# Patient Record
Sex: Male | Born: 1935 | Race: White | Hispanic: No | Marital: Married | Smoking: Current every day smoker
Health system: Southern US, Community
[De-identification: ages and names within clinical notes are randomized; demographics above are authoritative.]

## PROBLEM LIST (undated history)

## (undated) DIAGNOSIS — N289 Disorder of kidney and ureter, unspecified: Secondary | ICD-10-CM

## (undated) DIAGNOSIS — I714 Abdominal aortic aneurysm, without rupture, unspecified: Secondary | ICD-10-CM

## (undated) DIAGNOSIS — N183 Chronic kidney disease, stage 3 unspecified: Secondary | ICD-10-CM

## (undated) DIAGNOSIS — R55 Syncope and collapse: Secondary | ICD-10-CM

## (undated) DIAGNOSIS — I10 Essential (primary) hypertension: Secondary | ICD-10-CM

## (undated) DIAGNOSIS — I639 Cerebral infarction, unspecified: Secondary | ICD-10-CM

## (undated) HISTORY — PX: FRACTURE SURGERY: SHX138

## (undated) HISTORY — DX: Syncope and collapse: R55

## (undated) HISTORY — DX: Cerebral infarction, unspecified: I63.9

## (undated) HISTORY — DX: Disorder of kidney and ureter, unspecified: N28.9

## (undated) HISTORY — PX: APPENDECTOMY: SHX54

## (undated) HISTORY — DX: Abdominal aortic aneurysm, without rupture, unspecified: I71.40

## (undated) HISTORY — PX: EYE SURGERY: SHX253

## (undated) HISTORY — PX: HEMORRHOID SURGERY: SHX153

## (undated) HISTORY — DX: Abdominal aortic aneurysm, without rupture: I71.4

## (undated) HISTORY — DX: Essential (primary) hypertension: I10

## (undated) HISTORY — PX: SPINE SURGERY: SHX786

## (undated) HISTORY — PX: THYROID SURGERY: SHX805

---

## 1998-04-30 ENCOUNTER — Inpatient Hospital Stay (HOSPITAL_COMMUNITY): Admission: EM | Admit: 1998-04-30 | Discharge: 1998-05-05 | Payer: Self-pay | Admitting: Emergency Medicine

## 1998-05-22 ENCOUNTER — Ambulatory Visit (HOSPITAL_COMMUNITY): Admission: RE | Admit: 1998-05-22 | Discharge: 1998-05-22 | Payer: Self-pay | Admitting: Orthopedic Surgery

## 1998-05-26 ENCOUNTER — Encounter (HOSPITAL_COMMUNITY): Admission: RE | Admit: 1998-05-26 | Discharge: 1998-08-24 | Payer: Self-pay | Admitting: Orthopedic Surgery

## 1998-10-06 ENCOUNTER — Ambulatory Visit (HOSPITAL_COMMUNITY): Admission: RE | Admit: 1998-10-06 | Discharge: 1998-10-06 | Payer: Self-pay | Admitting: Gastroenterology

## 2001-04-24 ENCOUNTER — Emergency Department (HOSPITAL_COMMUNITY): Admission: EM | Admit: 2001-04-24 | Discharge: 2001-04-24 | Payer: Self-pay | Admitting: Emergency Medicine

## 2001-06-12 ENCOUNTER — Encounter: Admission: RE | Admit: 2001-06-12 | Discharge: 2001-06-12 | Payer: Self-pay

## 2003-10-09 ENCOUNTER — Encounter: Admission: RE | Admit: 2003-10-09 | Discharge: 2003-10-09 | Payer: Self-pay | Admitting: Family Medicine

## 2005-12-14 ENCOUNTER — Encounter: Payer: Self-pay | Admitting: Emergency Medicine

## 2005-12-14 ENCOUNTER — Encounter: Payer: Self-pay | Admitting: Cardiology

## 2005-12-14 ENCOUNTER — Encounter: Payer: Self-pay | Admitting: Orthopedic Surgery

## 2006-10-27 ENCOUNTER — Ambulatory Visit (HOSPITAL_COMMUNITY): Admission: RE | Admit: 2006-10-27 | Discharge: 2006-10-28 | Payer: Self-pay | Admitting: Ophthalmology

## 2007-04-07 ENCOUNTER — Ambulatory Visit: Payer: Self-pay | Admitting: Family Medicine

## 2007-04-07 ENCOUNTER — Observation Stay (HOSPITAL_COMMUNITY): Admission: EM | Admit: 2007-04-07 | Discharge: 2007-04-08 | Payer: Self-pay | Admitting: Emergency Medicine

## 2008-04-03 ENCOUNTER — Ambulatory Visit (HOSPITAL_COMMUNITY): Admission: RE | Admit: 2008-04-03 | Discharge: 2008-04-04 | Payer: Self-pay | Admitting: Ophthalmology

## 2008-04-03 ENCOUNTER — Encounter (INDEPENDENT_AMBULATORY_CARE_PROVIDER_SITE_OTHER): Payer: Self-pay | Admitting: Ophthalmology

## 2010-06-23 ENCOUNTER — Ambulatory Visit (HOSPITAL_COMMUNITY): Admission: RE | Admit: 2010-06-23 | Discharge: 2010-06-23 | Payer: Self-pay | Admitting: Physician Assistant

## 2010-12-28 NOTE — Op Note (Signed)
NAME:  Larry Aguilar, Larry Aguilar                ACCOUNT NO.:  [id number]   MEDICAL RECORD NO.:  [id number]          PATIENT TYPE:  OIB   LOCATION:  [id number]                         FACILITY:  [facility]   PHYSICIAN:  Chrystie Nose. Zigmund Daniel, M.D. DATE OF BIRTH:  Sep 21, 1933   DATE OF PROCEDURE:  DATE OF DISCHARGE:                               OPERATIVE REPORT   ADMISSION DIAGNOSIS:  Retained lens material, dislocated intraocular  lens into the vitreous, left eye.   PROCEDURES:  1. Pars plana vitrectomy with removal of intraocular lens from      vitreous.  2. Panretinal photocoagulation.  3. Placement of secondary intraocular lens with suture.  4. Gas injection, left eye.   SURGEON:  Chrystie Nose. Zigmund Daniel, MD   ASSISTANT:  Deatra Ina, MA   ANESTHESIA:  General.   DETAILS:  With usual prep and drape, peritomy from 8 o'clock around to 4  o'clock.  Two scleral flaps were raised at 3 o'clock and 9 o'clock in  anticipation of IOL suture.  Corneoscleral wound was created with a 3  layer technique from 11 o'clock to 2 o'clock.  The 25-gauge trocars were  placed at 10, 2, and 4 o'clock.  The pars plana vitrectomy was begun  just behind the pupillary axis.  Blood and vitreous was carefully  removed under low suction and rapid cutting.  The corneoscleral wound  was opened and the intraocular lens was freed from its attachments of  the vitreous.  The 25-gauge forceps were used to grasp the lens and sent  it into the anterior chamber.  The 20-gauge forceps were used to grasp  the lens in the anterior chamber and to dilate out of the eye.  Two 10-0  Prolene sutures were passed from 3 o'clock to 9 o'clock behind the iris  and anterior to the capsular remnants.  These sutures were externalized  through the corneal wound with a scleral wound.  A new intraocular lens  made by [employer] model CZ70BD, power 16.0D, length 12.5  mm, optic 7.0 mm, serial [id number], expiration date June 2012, was  brought onto  the field.  The lens was inspected and cleaned.  The  Prolene sutures were brought out through the scleral wound and attached  to the eyelids of the lens.  The lens was positioned in the posterior  chamber and dialed into place.  The corneal wound was closed with four  10-0 nylon sutures.  It was tested and found to be tight.  Additional  pars plana vitrectomy was performed behind the pseudophacos.  Some  additional pigment, granules, and vitreous was encountered and removed.  The indirect ophthalmoscope laser was moved into place, 636 burns were  placed around the periphery.  The power was 500 mW, 1000 microns each,  and 0.1 seconds each.  A gas injection was used to reinflate the globe.  The 25-gauge trocars were removed.  The wounds were tested and found to  be tight.  Conjunctiva was closed with 7-0 chromic suture.  Polymyxin  and gentamicin were irrigated into Tenon space.  Marcaine was injected  around  the globe for postop pain.  Decadron 10 mg was injected into the  lower subconjunctival space.  Polysporin ophthalmic ointment, a patch,  and shield were placed.  Closing pressure was 15 with a Barraquer  tonometer.   COMPLICATIONS:  None.   DURATION:  1 hour 30 minutes.   The patient was awakened and taken to recovery in satisfactory  condition.      Chrystie Nose. Zigmund Daniel, M.D.  Electronically Signed     JDM/MEDQ  D:  04/03/2008  T:  04/04/2008  Job:  [id number]

## 2010-12-28 NOTE — Discharge Summary (Signed)
NAME:  Larry Aguilar, Larry Aguilar NO.:  [id number]   MEDICAL RECORD NO.:  [id number]          PATIENT TYPE:  OBV   LOCATION:  [id number]                         FACILITY:  [facility]   PHYSICIAN:  Carin Hock, MD   DATE OF BIRTH:  [birth year]   DATE OF ADMISSION:  04/07/2007  DATE OF DISCHARGE:  04/08/2007                               DISCHARGE SUMMARY   PRIMARY CARE PHYSICIAN:  Nurse practitioner, Cyndi Bender at the office  of Dr. Lyndee Hensen in [place].   CONSULTANTS:  None.   PROCEDURES:  None.   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   DISCHARGE DIAGNOSIS:  Near-syncope.   SECONDARY DIAGNOSES:  1. Hypertension.  2. Hyperlipidemia.   LABORATORY DATA:  Includes electrolytes which were completely within  normal limits with a sodium of 139, potassium 3.6, chloride 109, bicarb  25, BUN 23, creatinine 1.03.  Glucose 106, total bilirubin 0.8, alk phos  63, AST 8, ALT 14, total protein 6.7, albumin 3.6, calcium 9.0.  PT/PTT  and INR were also within normal limits with a PTT of 28, PT of 13.5, INR  1.0.  CBC and differential all within normal limits.  White blood cell  count 7.5, hemoglobin 13.8, hematocrit 39.6 and platelets 162.  Cardiac  enzymes were cycled in the ER.  Point of care enzymes included a CK-MB  of 3.7, a troponin of less than 0.05 and a myoglobin of 134.  However,  subsequent cardiac enzymes showed an isolated increase in CK-MB  initially at 5.5 early in the morning of April 08, 2007, and than later  in the morning of April 08, 2007, 4.7 CK-MB.  CK and troponin were both  normal.  CK was 177 and then later 145, troponin was 0.01 and later  0.02.   DISCHARGE MEDICATIONS:  The patient was discharged on the same  medications he had been taking at home.   These include:  1. Fish oil.  2. Garlic pills.  3. Lisinopril.  4. Hydrochlorothiazide combo pill 20/25 mg 1 tab daily.   HOSPITAL COURSE:  1. Near-syncope:  There are multiple possible causes for this syncopal      episode including vasovagal orthostatic hypotension,      cardiovascular, neurologic, hypoglycemia.  The most likely cause in      this case seems to be hypoglycemia as the patient had a sudden      onset of diaphoresis, weakness and nausea.  He had not eaten for 6      hours prior to this episode.  He did not have any chest pain or      shortness of breath with this episode so this seems to be the most      likely cause.  However, other possible causes include a vasovagal      response, however, the patient does not report any straining before      or  during this episode.  Orthostatic hypertension is another      possible cause.  As the patient had not consumed any fluids while      he was working the morning of this event and he is on a diuretic.      Cardiac causes may also be contributing, however, his EKG only      showed first degree AV block and sinus bradycardia and possibly      some borderline LVH.  Point of care cardiac enzymes were negative      and there were no reports of arrhythmia while the patient was      monitored on telemetry.  Also the patient did not report any      shortness of breath or chest pain or palpitations with this      episode.  However, as mentioned in the lab section there were 2      isolated measurements of elevated CK-MB at 5.5 and at 4.7.  Also      the relative index of CK-MB to CK was elevated first at 3.1 and      later at 3.2.  This probably was not an MI given there were no      concerning findings on EKG for an MI or ischemia but the patient      does have multiple risk factors such as smoking, hyperlipidemia and      hypertension which put him at risk for cardiac disease.  We      recommend that the patient get a cardiac stress  test.  Other      possibilities we considered were neurological causes such as a      stroke or TIA but the patient had a normal head CT showing only      chronic ischemic changes and his neuro exam was grossly normal.      Over night we gently hydrated the patient with normal saline at 50      mL an hour.  We cycled cardiac enzymes result of which are      mentioned previously and monitored him overnight on telemetry.      There were no events and no arrhythmias while he was being      monitored on telemetry.  2. Hypertension:  We continued the patient's lisinopril,      hydrochlorothiazide.  He was discharged on these medicines.  3. Hyperlipidemia.  We continued fish oil while in the hospital and      discharged the patient instructing him to take his garlic pills and      his fish oil as previously prescribed.   The patient's condition at the time of discharge stable.  Pending test  results at the time of discharge.  We checked a hemoglobin A1c which is  not back at the time of this dictation.  Will put that result in a  subsequent discharge summary addendum.   DISCHARGE FOLLOWUP:  The patient is instructed to follow up with Cyndi Bender at [facility] office.  He has an appointment already scheduled for  this week.   FOLLOW UP ISSUES:  1. Cardiac stress test is probably warranted in this patient given his      risk factors and the above mentioned test results.  2. Possible follow up for hypoglycemia.  Wife reports that the patient      has increasingly had polydipsia and may have some issue with blood      sugar regulation  especially if this was, in deed, a hypoglycemia      event.  Will follow up on hemoglobin A1c result and will put that      result in a discharge addendum.      Carin Hock, MD  Electronically Signed     SO/MEDQ  D:  04/08/2007  T:  04/08/2007  Job:  [id number]   cc:   Cyndi Bender at Dr. Onnie Boer office

## 2010-12-28 NOTE — H&P (Signed)
NAME:  Larry Aguilar, Larry Aguilar NO.:  [id number]   MEDICAL RECORD NO.:  [id number]          PATIENT TYPE:  OBV   LOCATION:  6711                         FACILITY:  [facility]   PHYSICIAN:  Talbert Cage, M.D.DATE OF BIRTH:  [birth year]   DATE OF ADMISSION:  04/07/2007  DATE OF DISCHARGE:  04/08/2007                              HISTORY & PHYSICAL   PRIMARY CARE PHYSICIAN:  Dr. Lyndee Hensen in [place]; patient actually  sees her PA, Sharol Roussel   CHIEF COMPLAINT:  Near-syncope.   HISTORY OF PRESENTING ILLNESS:  Patient is a 77 year old male with a  history of hypertension, hyperlipidemia and questionable past CVA, who  presents with a brief episode of lightheadedness, nausea, diaphoresis  and weakness.  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  He was in an air-conditioned tractor; he then got  out of tractor and got into his truck which is also air conditioned when  he suddenly became lightheaded, diaphoretic, nauseous and weak.  He was  able to exit the truck and sit down.  He never loss consciousness.  He  also denies chest pain, one-sided weakness, shortness of breath and  vomiting.  After about one hour, he felt considerably better; he was no  longer dizzy, nauseous or diaphoretic, but he did continue to be weak.  His wife insisted on calling the ambulance and he was brought via  ambulance to [facility] Emergency [facility].  Of note, the patient had  gone about six hours without eating or drinking anything prior to this  episode.   REVIEW OF SYSTEMS:  Positive for recent polydipsia, coughing and nausea.  Negative for fever, chills, vomiting, chest pain, shortness of breath,  dysuria, changes in bowel movements, focal weakness.   In the ER, the patient got a bolus of normal saline one liter, an EKG,  CT head and a chest x-ray.   PAST MEDICAL HISTORY:  Patient has:  1. Hypertension.  2. Hyperlipidemia.  3. Reports that he  had a mini-stroke in the past.   MEDICATION ALLERGIES:  INCLUDE STATINS WHICH CAUSE MYALGIAS AND  PENICILLIN WHICH MAY CAUSE ANAPHYLAXIS, NOT SURE BASED ON THE PATIENT'S  DESCRIPTION.   MEDICATIONS:  Include:  1. Lisinopril/hydrochlorothiazide 20/25 one tablet daily.  2. Fish oil.  3. Garlic pills.   PAST SURGERIES:  Include:  1. Cataract surgery.  2. Repair of a broken hip.  3. Removal of a benign growth from the patient's throat.  4. Hernia repair.   SOCIAL HISTORY:  Patient lives with his wife, has about 110-pack-year  history, smoked 55 years about two packs a day, no alcohol, no illicit  drug.  Patient works on his farm, does quite a bit of physical labor.   PHYSICAL EXAMINATION:  VITAL SIGNS:  Temperature 97, pulse 62,  respirations 18, blood pressure 115/69, oxygen saturation 97% on room  air.  GENERAL:  Patient resting comfortably in no acute distress.  HEENT:  Patient's head was normocephalic, atraumatic.  Pupils were  equally round and reactive to light.  Extraocular movements intact.  Oropharynx was clear.  Mucous membranes were moist.  CARDIOVASCULAR EXAM:  Distant heart sounds, but regular rate and rhythm.  No murmurs, rubs or gallops.  PULMONARY EXAM:  Patient was clear to auscultation bilaterally.  No  wheezes, rhonchi or rales.  ABDOMEN:  Soft, nontender, nondistended.  Positive bowel sounds.  EXTREMITIES:  No clubbing, cyanosis or edema.  2+ dorsalis pedis pulses  bilaterally.  NEURO EXAM:  Cranial nerves II-XII were grossly intact.  Patient was  appropriate, alert and oriented, answered questions, seemed to be a good  historian.  Strength was 5/5 in upper and lower extremities, equal on  both sides.   LABS:  UA was within normal limits.  Electrolytes were within normal  limits with a sodium of 139, potassium 3.6, chloride 109, bicarb 25, BUN  23, creatinine 1.03, glucose 106.  Total bilirubin 0.8, alk phos 63, AST  8, ALT 14, total protein 6.7, albumin 3.6  and calcium 9.0.  CBC was also  within normal limits with a white blood cell count of 7.5, hemoglobin  13.8, hematocrit 39.6, platelets of 162; differential was within normal  limits.  PTT was 28, PT was 13.5, INR was 1.0.  Cardiac enzymes were  within normal limits with a CK-MB of 3.7, a troponin of less than 0.05  and a myoglobin of 134.   Chest x-ray showed chronic COPD, but no acute chest process.   CT head showed chronic ischemic changes, most notably in the cerebellum  right more so than left, but no acute abnormality.   ASSESSMENT/PLAN:  Patient is a 77 year old male with a past medical  history of hyperlipidemia, hypertension and questionable cerebrovascular  accident, who is admitted for observation after a near-syncope episode.   1. Near-syncope.  Possible causes include vasovagal response,      orthostatic hypotension, cardiovascular causes, neurologic causes      and hypoglycemia.  Most likely cause in this case seems to be      hypoglycemia given the sudden onset and description of the symptoms      with sudden diaphoresis, nausea, lightheadedness.  Also, the      patient had not eaten in about six hours.  Another possible cause      is orthostatic hypotension, as the patient reports he did not drink      fluids while he was working; he is also on a diuretic, however this      would not explain his diaphoresis and his nausea.  Additionally,      patient is a high risk for cardiac causes with a history of      smoking, hypertension and hyperlipidemia.  His EKG showed only      first-degree AV block and sinus bradycardia and some borderline      left ventricular hypertrophy.  Point-of-care cardiac enzymes were      negative and there were no reports of arrhythmias while on the tele      monitor in the emergency room.  Additionally, patient is without      complaints of chest pain or shortness of breath or palpitations      during or after this episode so this seem to be a  less likely      cause.  Vasovagal syncope is another possibility, but the patient      does not report any straining.  Additionally, that would not      explain his diaphoresis or his nausea.  Neurological causes such as  a stroke or transient ischemic attack are possibilities, but there      are no reports of focal weakness, nor none seen on exam.  Head CT      showed no acute processes and his neuro exam was grossly normal.      Plan:  We will cycle cardiac enzymes, place the patient on tele,      check a B-Met in the morning, check a hemoglobin A1c, check      orthostatic vitals and will gently hydrate overnight.  2. Hypertension.  Will continue lisinopril/hydrochlorothiazide.  3. Hyperlipidemia.  Will continue fish oil.  4. Disposition:  Patient can probably discharge home in the morning      with followup with his primary care Kylin Dubs provided his cardiac      enzymes are normal and there are no other gross abnormalities on      his studies.      Carin Hock, MD  Electronically Signed      Talbert Cage, M.D.  Electronically Signed    SO/MEDQ  D:  04/07/2007  T:  04/08/2007  Job:  [id number]

## 2010-12-29 ENCOUNTER — Encounter (HOSPITAL_COMMUNITY)
Admission: RE | Admit: 2010-12-29 | Discharge: 2010-12-29 | Disposition: A | Discharge status: Home / Self Care | Payer: Medicare Other | Source: Ambulatory Visit | Attending: Ophthalmology | Admitting: Ophthalmology

## 2010-12-29 ENCOUNTER — Ambulatory Visit (HOSPITAL_COMMUNITY)
Admission: RE | Admit: 2010-12-29 | Discharge: 2010-12-29 | Disposition: A | Discharge status: Home / Self Care | Payer: Medicare Other | Source: Ambulatory Visit | Attending: Ophthalmology | Admitting: Ophthalmology

## 2010-12-29 ENCOUNTER — Other Ambulatory Visit: Payer: Self-pay | Admitting: Ophthalmology

## 2010-12-29 ENCOUNTER — Other Ambulatory Visit (HOSPITAL_COMMUNITY): Payer: Self-pay | Admitting: Ophthalmology

## 2010-12-29 DIAGNOSIS — R059 Cough, unspecified: Secondary | ICD-10-CM | POA: Insufficient documentation

## 2010-12-29 DIAGNOSIS — Z01818 Encounter for other preprocedural examination: Secondary | ICD-10-CM | POA: Insufficient documentation

## 2010-12-29 DIAGNOSIS — R05 Cough: Secondary | ICD-10-CM | POA: Insufficient documentation

## 2010-12-29 DIAGNOSIS — H409 Unspecified glaucoma: Secondary | ICD-10-CM | POA: Insufficient documentation

## 2010-12-29 DIAGNOSIS — Z01812 Encounter for preprocedural laboratory examination: Secondary | ICD-10-CM | POA: Insufficient documentation

## 2010-12-29 DIAGNOSIS — J4489 Other specified chronic obstructive pulmonary disease: Secondary | ICD-10-CM | POA: Insufficient documentation

## 2010-12-29 DIAGNOSIS — J449 Chronic obstructive pulmonary disease, unspecified: Secondary | ICD-10-CM | POA: Insufficient documentation

## 2010-12-29 LAB — CBC
HCT: 38.5 % — ABNORMAL LOW (ref 39.0–52.0)
MCH: 32.2 pg (ref 26.0–34.0)
MCHC: 34.8 g/dL (ref 30.0–36.0)
MCV: 92.5 fL (ref 78.0–100.0)
Platelets: 176 10*3/uL (ref 150–400)
RDW: 13.1 % (ref 11.5–15.5)
WBC: 7.1 10*3/uL (ref 4.0–10.5)

## 2010-12-29 LAB — BASIC METABOLIC PANEL
BUN: 21 mg/dL (ref 6–23)
Calcium: 9.2 mg/dL (ref 8.4–10.5)
Creatinine, Ser: 1.43 mg/dL (ref 0.4–1.5)
GFR calc non Af Amer: 48 mL/min — ABNORMAL LOW (ref >60)
Glucose, Bld: 98 mg/dL (ref 70–99)
Potassium: 3.8 mEq/L (ref 3.5–5.1)

## 2010-12-31 NOTE — Op Note (Signed)
NAME:  THAO, DAFFERN                ACCOUNT NO.:  [id number]   MEDICAL RECORD NO.:  [id number]          PATIENT TYPE:  AMB   LOCATION:  SDS                          FACILITY:  [facility]   PHYSICIAN:  John D. Zigmund Daniel, M.D. DATE OF BIRTH:  1934-03-15   DATE OF PROCEDURE:  10/27/2006  DATE OF DISCHARGE:                               OPERATIVE REPORT   ADMISSION DIAGNOSIS:  Retained lens material, capsular fibrosis, left  eye.   PROCEDURE:  Removal of retained lens material, pars plana vitrectomy,  removal of vitreous membranes, and posterior capsulectomy, all in the  left eye.   SURGEON:  Chrystie Nose. Zigmund Daniel, M.D.   ASSISTANT:  Thurman Coyer. Lundquist, P.A.-C.   ANESTHESIA:  General.   DETAILS:  Usual prep and drape, 25 gauge trocars placed at 10, 2 and 4  o'clock.  Infusion at 4 o'clock.  The pars plana vitrectomy was begun  just behind the crystalline lens where retained white lens material was  encountered.  This was carefully removed from around the intraocular  lens.  The lens was stable and did not move. Provisc was placed on the  corneal surface. The vitrectomy was carried posteriorly where membranes  were apparent on the retinal surface.  These were peeled with a lighted  pick and removed with the vitreous cutter.  Large chunks of white lens  material were removed and yellow nuclear material was removed with the  chop technique using the cutter and the lighted pick.  Scleral  depression was used to gain access to the vitreous base and additional  lens material was seen here and removed. The endolaser was positioned in  the eye.  537 burns were placed around the retinal periphery with a  power 1000 milliwatts, 1000 microns each, and 0.1 seconds each.  Additional lens fragments were encountered and carefully removed under  low suction and rapid cutting. The instruments were removed from the  eye.  The trocars were removed.  The wounds were held tightly until they  were watertight.   The indirect ophthalmoscope laser was moved into  place. 1099 burns were placed around the retinal periphery with a power  660 milliwatts, 1000 microns each, and 0.1 seconds each.  Polymyxin and  gentamicin were irrigated into tenon's space.  Marcaine was injected  around the globe for postop pain. Decadron 10 mg was injected into the  lower subconjunctival space.  TobraDex ophthalmic ointment, a patch and  shield were placed.  The closing pressure was 10 with the [employer].  Complications none.  Duration 1 hour.  The patient was  awakened and taken to recovery in satisfactory condition.      Chrystie Nose. Zigmund Daniel, M.D.  Electronically Signed     JDM/MEDQ  D:  10/27/2006  T:  10/29/2006  Job:  [id number]

## 2011-01-05 ENCOUNTER — Ambulatory Visit (HOSPITAL_COMMUNITY)
Admission: RE | Admit: 2011-01-05 | Discharge: 2011-01-05 | Disposition: A | Discharge status: Home / Self Care | Payer: Medicare Other | Source: Ambulatory Visit | Attending: Ophthalmology | Admitting: Ophthalmology

## 2011-01-05 DIAGNOSIS — J4489 Other specified chronic obstructive pulmonary disease: Secondary | ICD-10-CM | POA: Insufficient documentation

## 2011-01-05 DIAGNOSIS — Z8673 Personal history of transient ischemic attack (TIA), and cerebral infarction without residual deficits: Secondary | ICD-10-CM | POA: Insufficient documentation

## 2011-01-05 DIAGNOSIS — J449 Chronic obstructive pulmonary disease, unspecified: Secondary | ICD-10-CM | POA: Insufficient documentation

## 2011-01-05 DIAGNOSIS — I10 Essential (primary) hypertension: Secondary | ICD-10-CM | POA: Insufficient documentation

## 2011-01-05 DIAGNOSIS — H409 Unspecified glaucoma: Secondary | ICD-10-CM | POA: Insufficient documentation

## 2011-01-05 DIAGNOSIS — F17200 Nicotine dependence, unspecified, uncomplicated: Secondary | ICD-10-CM | POA: Insufficient documentation

## 2011-01-05 DIAGNOSIS — H4011X0 Primary open-angle glaucoma, stage unspecified: Secondary | ICD-10-CM | POA: Insufficient documentation

## 2011-01-05 LAB — SURGICAL PCR SCREEN: Staphylococcus aureus: NEGATIVE

## 2011-02-03 NOTE — Op Note (Signed)
NAME:  Larry Aguilar, Larry Aguilar NO.:  [id number]  MEDICAL RECORD NO.:  [id number]           PATIENT TYPE:  O  LOCATION:  SDSC                         FACILITY:  [facility]  PHYSICIAN:  Marylynn Pearson, M.D.     DATE OF BIRTH:  [birth year]  DATE OF PROCEDURE:  01/05/2011 DATE OF DISCHARGE:  01/05/2011                              OPERATIVE REPORT   PREOPERATIVE DIAGNOSIS:  Uncontrolled primary open-angle glaucoma with preexisting scarring from previous retinal and cataract surgery, left eye.  POSTOPERATIVE DIAGNOSIS:  Uncontrolled primary open-angle glaucoma with preexisting scarring from previous retinal and cataract surgery, left eye.  PROCEDURE:  An Ahmed valve with mitomycin C.  COMPLICATIONS:  None.  ANESTHESIA:  Consisted of 2% Xylocaine and a 50/50 mixture of 0.75% Marcaine with ampule of Wydase.  PROCEDURE IN DETAILS:  The patient was transported to the operating room where under monitored anesthesia, he was given a peribulbar block with the aforementioned local anesthetic agent.  Following this, the patient's face was prepped and draped in usual sterile fashion after pressure had been applied to the eye.  With the surgeon sitting superiorly, a 6-0 nylon suture was passed through clear cornea to infraduct the eye and additional 6-0 suture was passed superior nasal. With the eye in the intraductal position, it was noted there was extensive conjunctival scarring.  A Hoskin forceps was used to grasp the conjunctivae at the 11:30 position and a Westcott scissor was used to make an incision at the limbus extending temporally.  Using a blunt Westcott scissor, the incision was extended to form a fornix-based conjunctival flap.  Bleeding ensued and a tip blade was used to recess Tenon fibers.  Bleeding was controlled with cautery.  After the episcleral scarring had been cleared and bleeding had been controlled, mitomycin C 0.4 mg/mL was placed on a Gelfoam sponge and  applied to the superior temporal conjunctivae, allowed to stay on the eye for 4 minutes.  Sponge was removed and it was irrigated with 60 mL of balanced salt solution.  Following this, the Ahmed valve was examined.  It was primed.  It was noted to have no defects.  It was irrigated.  The Ahmed was a [id number], model FP7 flexible plate, lot number [id number].  Using a Bishop-Harmon forceps for the plate and a Hoskins forceps to elevate the conjunctiva, the plate was gently placed and passed on the sclera and into the superior temporal quadrant until the eyelets were as measured 8 mm from the corneoscleral limbus.  Following this, a 4-0 Mersilene suture on a spatula needle was then used to suture the eyelets approximately 7.5 mm from the corneoscleral limbus.  After this was in place, the tube attached to the Ahmed, was cut, bevel up with a Emergency planning/management officer.  Then using a Wheeler blade at the 4 o'clock position, the clear cornea was entered and Provisc was injected in the eye.  Following this using a 22-gauge needle at the 12 o'clock position, the anterior chamber was entered without touching cornea or iris.  It was then removed.  Provisc was injected in the eye.  Using a  tube inserter and a tying forceps, the tube was placed.  It was noted to extend past the pupil into the visual axis.  Therefore the tube was withdrawn and it was shortened.  It was then placed again and noted to not touch the iris or the lens and it was in perfect position.  Therefore a 9-0 nylon suture was used to suture the tube to the sclera.  Following this the Tutoplast was then taken from the package and fashioned.  The Tutoplast graft was reference number [id number].  It was noted to have no defects.  It was irrigated.  It was fashioned.  It was placed over the tube and sutured with 4 interrupted 9-0 nylon sutures.  Following this, the conjunctiva was then sutured with a 9-0 Vicryl on a BV 100 needle.  It was sutured at  the limbus.  It was difficult for closure because of the extensive scarring and some bleeding.  It was sutured until all areas exposed, areas of Tutoplast and the tube were completely covered.  BSS was injected in the anterior chamber.  The chamber deepened.  Viscoelastic was expressed from the eye.  The incision was Seidel negative.  A subconjunctival injection of Kenalog 4 mg was placed in the superior and inferior quadrant subconjunctivally.  There being no leakage, all instrumentation removed from the eye.  The sutures were removed from the eye.  The lid speculum was removed.  The anterior chamber was deep. Tube was in a good position.  Topical TobraDex ointment was applied to the eye.  A patch and Fox shield were placed and the patient returned to recovery area in a stable condition.  Complications were none.     Marylynn Pearson, M.D.     RW/MEDQ  D:  01/05/2011  T:  01/06/2011  Job:  [id number]  cc:   [phone]  Electronically Signed by Marylynn Pearson M.D. on 02/03/2011 08:05:53 PM

## 2011-05-27 LAB — URINALYSIS, ROUTINE W REFLEX MICROSCOPIC
Nitrite: NEGATIVE
Protein, ur: NEGATIVE
Specific Gravity, Urine: 1.017
Urobilinogen, UA: 0.2

## 2011-05-27 LAB — CARDIAC PANEL(CRET KIN+CKTOT+MB+TROPI)
CK, MB: 5.5 — ABNORMAL HIGH
Relative Index: 3.1 — ABNORMAL HIGH
Total CK: 177
Troponin I: 0.01
Troponin I: 0.02

## 2011-05-27 LAB — PROTIME-INR
INR: 1
Prothrombin Time: 13.5

## 2011-05-27 LAB — BASIC METABOLIC PANEL
BUN: 20
Calcium: 8.7
Chloride: 103
Creatinine, Ser: 1.08
GFR calc non Af Amer: >60

## 2011-05-27 LAB — DIFFERENTIAL
Eosinophils Relative: 2
Lymphocytes Relative: 19
Lymphs Abs: 1.4
Monocytes Relative: 6
Neutrophils Relative %: 72

## 2011-05-27 LAB — COMPREHENSIVE METABOLIC PANEL
AST: 8
CO2: 25
Calcium: 9
Creatinine, Ser: 1.03
GFR calc Af Amer: >60
GFR calc non Af Amer: >60
Sodium: 139
Total Protein: 6.7

## 2011-05-27 LAB — CBC
MCHC: 34.9
MCV: 95.5
RBC: 4.15 — ABNORMAL LOW
RDW: 13.3

## 2011-05-27 LAB — POCT CARDIAC MARKERS: Troponin i, poc: <0.05

## 2011-05-27 LAB — HEPATITIS PANEL, ACUTE
Hep B C IgM: NEGATIVE
Hepatitis B Surface Ag: NEGATIVE

## 2011-05-27 LAB — HEMOGLOBIN A1C: Hgb A1c MFr Bld: 5.8

## 2011-11-29 ENCOUNTER — Encounter: Payer: Self-pay | Admitting: *Deleted

## 2012-05-21 ENCOUNTER — Other Ambulatory Visit: Payer: Self-pay | Admitting: Cardiovascular Disease

## 2012-05-21 DIAGNOSIS — I714 Abdominal aortic aneurysm, without rupture: Secondary | ICD-10-CM

## 2012-05-22 LAB — CREATININE, SERUM: Creat: 1.63 mg/dL — ABNORMAL HIGH (ref 0.50–1.35)

## 2012-05-24 ENCOUNTER — Ambulatory Visit
Admission: RE | Admit: 2012-05-24 | Discharge: 2012-05-24 | Disposition: A | Discharge status: Home / Self Care | Payer: Medicare Other | Source: Ambulatory Visit | Attending: Cardiovascular Disease | Admitting: Cardiovascular Disease

## 2012-05-24 DIAGNOSIS — I714 Abdominal aortic aneurysm, without rupture: Secondary | ICD-10-CM

## 2012-05-24 MED ORDER — IOHEXOL 300 MG/ML  SOLN: 75.0000 mL | Freq: Once | INTRAMUSCULAR | Status: AC | PRN

## 2012-06-12 HISTORY — PX: CARDIOVASCULAR STRESS TEST: SHX262

## 2012-08-15 HISTORY — PX: INGUINAL HERNIA REPAIR: SUR1180

## 2013-07-01 ENCOUNTER — Ambulatory Visit: Payer: Medicare Other | Admitting: Cardiovascular Disease

## 2013-07-02 ENCOUNTER — Encounter: Payer: Self-pay | Admitting: Cardiovascular Disease

## 2014-10-03 DIAGNOSIS — I714 Abdominal aortic aneurysm, without rupture: Secondary | ICD-10-CM | POA: Diagnosis not present

## 2014-10-03 DIAGNOSIS — M542 Cervicalgia: Secondary | ICD-10-CM | POA: Diagnosis not present

## 2014-10-03 DIAGNOSIS — E780 Pure hypercholesterolemia: Secondary | ICD-10-CM | POA: Diagnosis not present

## 2014-10-03 DIAGNOSIS — I10 Essential (primary) hypertension: Secondary | ICD-10-CM | POA: Diagnosis not present

## 2014-10-03 DIAGNOSIS — N183 Chronic kidney disease, stage 3 (moderate): Secondary | ICD-10-CM | POA: Diagnosis not present

## 2014-11-10 DIAGNOSIS — Q613 Polycystic kidney, unspecified: Secondary | ICD-10-CM | POA: Diagnosis not present

## 2014-11-10 DIAGNOSIS — I714 Abdominal aortic aneurysm, without rupture: Secondary | ICD-10-CM | POA: Diagnosis not present

## 2014-12-01 ENCOUNTER — Encounter: Payer: Self-pay | Admitting: Vascular Surgery

## 2014-12-02 ENCOUNTER — Encounter: Payer: Self-pay | Admitting: Vascular Surgery

## 2014-12-02 ENCOUNTER — Ambulatory Visit (INDEPENDENT_AMBULATORY_CARE_PROVIDER_SITE_OTHER): Payer: Medicare Other | Admitting: Vascular Surgery

## 2014-12-02 VITALS — BP 128/85 | HR 72 | Temp 97.6°F | Resp 16 | Ht 72.0 in | Wt 158.0 lb

## 2014-12-02 DIAGNOSIS — I714 Abdominal aortic aneurysm, without rupture, unspecified: Secondary | ICD-10-CM

## 2014-12-02 NOTE — Progress Notes (Signed)
Subjective:     Patient ID: Larry Aguilar, male   DOB: 07-13-1934, 81 y.o.   MRN: [id number]  HPI this 81 year old male was referred by Dr. Cyndi Bender for evaluation of abdominal aortic aneurysm. This is been followed for the past few years and is currently 4.4 x 4.0 cm maximum diameter by ultrasound. Patient has had no abdominal or back symptoms.  Past Medical History  Diagnosis Date  . Syncope   . AAA (abdominal aortic aneurysm)   . Hypertension   . Kidney disease     Left kidney mass,  polycystic kidney  . Stroke     History  Substance Use Topics  . Smoking status: Current Every Day Smoker -- 2.00 packs/day  . Smokeless tobacco: Not on file  . Alcohol Use: No    Family History  Problem Relation Age of Onset  . Heart attack Father   . Heart disease Father   . Hyperlipidemia Father   . Hypertension Father   . Cancer Mother     lymphoma   . Heart disease Mother   . Hyperlipidemia Mother   . Hypertension Mother   . Cancer Daughter     Thyroid cancer   . Hypertension Son   . Diabetes Son   . Hypertension Daughter     Allergies  Allergen Reactions  . Penicillins      Current outpatient prescriptions:  .  amLODipine (NORVASC) 5 MG tablet, Take 5 mg by mouth daily., Disp: , Rfl:  .  HYDROcodone-acetaminophen (NORCO/VICODIN) 5-325 MG per tablet, Take 1 tablet by mouth 3 (three) times daily as needed for moderate pain. , Disp: , Rfl:  .  lisinopril (PRINIVIL,ZESTRIL) 20 MG tablet, Take 20 mg by mouth daily., Disp: , Rfl:  .  meclizine (ANTIVERT) 25 MG tablet, Take 25 mg by mouth 3 (three) times daily as needed for dizziness., Disp: , Rfl:  .  omeprazole (PRILOSEC) 20 MG capsule, Take 20 mg by mouth daily., Disp: , Rfl:   Filed Vitals:   12/02/14 1112  BP: 128/85  Pulse: 72  Temp: 97.6 F (36.4 C)  TempSrc: Oral  Resp: 16  Height: 6' (1.829 m)  Weight: 158 lb (71.668 kg)  SpO2: 97%    Body mass index is 21.42 kg/(m^2).         Review of Systems  denies chest pain, dyspnea on exertion, PND, orthopnea. Patient has long history of tobacco abuse 2 packs a day for 65 years. Has known polycystic kidney disease on the left. Mild chronic renal insufficiency with creatinine 1.5.     Objective:   Physical Exam BP 128/85 mmHg  Pulse 72  Temp(Src) 97.6 F (36.4 C) (Oral)  Resp 16  Ht 6' (1.829 m)  Wt 158 lb (71.668 kg)  BMI 21.42 kg/m2  SpO2 97%  Gen.-alert and oriented x3 in no apparent distress HEENT normal for age Lungs no rhonchi or wheezing Cardiovascular regular rhythm no murmurs carotid pulses 3+ palpable no bruits audible Abdomen soft nontender no palpable masses Musculoskeletal free of  major deformities Skin clear -no rashes Neurologic normal Lower extremities 3+ femoral and posterior tibial pulses palpable bilaterally with no edema  Today I reviewed the report of the ultrasound performed in [place] in March 2016 which reveals it aneurysm to be 4.0 x 4.4 cm in maximum diameter       Assessment:     Abdominal aortic aneurysm-4.0 x 4.4 cm by ultrasound in [place] March 2016 Long history of tobacco abuse-ongoing  History polycystic kidney disease on the left with mild renal insufficiency creatinine 1.5    Plan:     Patient return in one year with CT angiogram of abdomen and pelvis to assess size and to determine if this would be a candidate for stent grafting if necessary Discussed symptoms of leakage or rupture with patient and his daughter

## 2014-12-04 NOTE — Addendum Note (Signed)
Addended by: Mena Goes on: 12/04/2014 01:17 PM   Modules accepted: Orders

## 2014-12-08 ENCOUNTER — Encounter: Payer: Self-pay | Admitting: Physician Assistant

## 2015-01-02 DIAGNOSIS — Z9181 History of falling: Secondary | ICD-10-CM | POA: Diagnosis not present

## 2015-01-02 DIAGNOSIS — N183 Chronic kidney disease, stage 3 (moderate): Secondary | ICD-10-CM | POA: Diagnosis not present

## 2015-01-02 DIAGNOSIS — I10 Essential (primary) hypertension: Secondary | ICD-10-CM | POA: Diagnosis not present

## 2015-01-02 DIAGNOSIS — Z1389 Encounter for screening for other disorder: Secondary | ICD-10-CM | POA: Diagnosis not present

## 2015-01-02 DIAGNOSIS — I714 Abdominal aortic aneurysm, without rupture: Secondary | ICD-10-CM | POA: Diagnosis not present

## 2015-01-02 DIAGNOSIS — M25559 Pain in unspecified hip: Secondary | ICD-10-CM | POA: Diagnosis not present

## 2015-04-03 DIAGNOSIS — E780 Pure hypercholesterolemia: Secondary | ICD-10-CM | POA: Diagnosis not present

## 2015-04-03 DIAGNOSIS — Z1389 Encounter for screening for other disorder: Secondary | ICD-10-CM | POA: Diagnosis not present

## 2015-04-03 DIAGNOSIS — I10 Essential (primary) hypertension: Secondary | ICD-10-CM | POA: Diagnosis not present

## 2015-04-03 DIAGNOSIS — Z125 Encounter for screening for malignant neoplasm of prostate: Secondary | ICD-10-CM | POA: Diagnosis not present

## 2015-04-03 DIAGNOSIS — I714 Abdominal aortic aneurysm, without rupture: Secondary | ICD-10-CM | POA: Diagnosis not present

## 2015-04-03 DIAGNOSIS — N183 Chronic kidney disease, stage 3 (moderate): Secondary | ICD-10-CM | POA: Diagnosis not present

## 2015-04-03 DIAGNOSIS — Z139 Encounter for screening, unspecified: Secondary | ICD-10-CM | POA: Diagnosis not present

## 2015-04-03 DIAGNOSIS — I679 Cerebrovascular disease, unspecified: Secondary | ICD-10-CM | POA: Diagnosis not present

## 2015-04-16 DIAGNOSIS — Z6822 Body mass index (BMI) 22.0-22.9, adult: Secondary | ICD-10-CM | POA: Diagnosis not present

## 2015-04-16 DIAGNOSIS — H6121 Impacted cerumen, right ear: Secondary | ICD-10-CM | POA: Diagnosis not present

## 2015-05-05 DIAGNOSIS — Z6822 Body mass index (BMI) 22.0-22.9, adult: Secondary | ICD-10-CM | POA: Diagnosis not present

## 2015-05-05 DIAGNOSIS — R972 Elevated prostate specific antigen [PSA]: Secondary | ICD-10-CM | POA: Diagnosis not present

## 2015-05-05 DIAGNOSIS — Z1389 Encounter for screening for other disorder: Secondary | ICD-10-CM | POA: Diagnosis not present

## 2015-05-05 DIAGNOSIS — M25559 Pain in unspecified hip: Secondary | ICD-10-CM | POA: Diagnosis not present

## 2015-10-22 DIAGNOSIS — I679 Cerebrovascular disease, unspecified: Secondary | ICD-10-CM | POA: Diagnosis not present

## 2015-10-22 DIAGNOSIS — R51 Headache: Secondary | ICD-10-CM | POA: Diagnosis not present

## 2015-10-22 DIAGNOSIS — I10 Essential (primary) hypertension: Secondary | ICD-10-CM | POA: Diagnosis not present

## 2015-10-22 DIAGNOSIS — M542 Cervicalgia: Secondary | ICD-10-CM | POA: Diagnosis not present

## 2015-10-26 DIAGNOSIS — H5203 Hypermetropia, bilateral: Secondary | ICD-10-CM | POA: Diagnosis not present

## 2015-10-26 DIAGNOSIS — H4089 Other specified glaucoma: Secondary | ICD-10-CM | POA: Diagnosis not present

## 2015-12-01 ENCOUNTER — Inpatient Hospital Stay: Admission: RE | Admit: 2015-12-01 | Payer: Medicare Other | Source: Ambulatory Visit

## 2015-12-08 ENCOUNTER — Ambulatory Visit: Payer: Medicare Other | Admitting: Vascular Surgery

## 2016-04-12 DIAGNOSIS — I10 Essential (primary) hypertension: Secondary | ICD-10-CM | POA: Diagnosis not present

## 2016-04-12 DIAGNOSIS — Z125 Encounter for screening for malignant neoplasm of prostate: Secondary | ICD-10-CM | POA: Diagnosis not present

## 2016-04-12 DIAGNOSIS — I714 Abdominal aortic aneurysm, without rupture: Secondary | ICD-10-CM | POA: Diagnosis not present

## 2016-04-12 DIAGNOSIS — Z139 Encounter for screening, unspecified: Secondary | ICD-10-CM | POA: Diagnosis not present

## 2016-04-12 DIAGNOSIS — K625 Hemorrhage of anus and rectum: Secondary | ICD-10-CM | POA: Diagnosis not present

## 2016-04-12 DIAGNOSIS — Z9181 History of falling: Secondary | ICD-10-CM | POA: Diagnosis not present

## 2016-04-12 DIAGNOSIS — N183 Chronic kidney disease, stage 3 (moderate): Secondary | ICD-10-CM | POA: Diagnosis not present

## 2016-04-22 ENCOUNTER — Other Ambulatory Visit: Payer: Self-pay

## 2016-04-22 DIAGNOSIS — I714 Abdominal aortic aneurysm, without rupture, unspecified: Secondary | ICD-10-CM

## 2016-04-26 ENCOUNTER — Other Ambulatory Visit: Payer: Self-pay | Admitting: *Deleted

## 2016-04-26 DIAGNOSIS — I714 Abdominal aortic aneurysm, without rupture, unspecified: Secondary | ICD-10-CM

## 2016-04-26 DIAGNOSIS — Z01812 Encounter for preprocedural laboratory examination: Secondary | ICD-10-CM

## 2016-04-29 DIAGNOSIS — M5137 Other intervertebral disc degeneration, lumbosacral region: Secondary | ICD-10-CM | POA: Diagnosis not present

## 2016-04-29 DIAGNOSIS — M47897 Other spondylosis, lumbosacral region: Secondary | ICD-10-CM | POA: Diagnosis not present

## 2016-04-29 DIAGNOSIS — I714 Abdominal aortic aneurysm, without rupture: Secondary | ICD-10-CM | POA: Diagnosis not present

## 2016-04-29 DIAGNOSIS — K5730 Diverticulosis of large intestine without perforation or abscess without bleeding: Secondary | ICD-10-CM | POA: Diagnosis not present

## 2016-04-29 DIAGNOSIS — M47896 Other spondylosis, lumbar region: Secondary | ICD-10-CM | POA: Diagnosis not present

## 2016-04-29 DIAGNOSIS — M5136 Other intervertebral disc degeneration, lumbar region: Secondary | ICD-10-CM | POA: Diagnosis not present

## 2016-04-29 DIAGNOSIS — Q446 Cystic disease of liver: Secondary | ICD-10-CM | POA: Diagnosis not present

## 2016-05-02 ENCOUNTER — Encounter: Payer: Self-pay | Admitting: Vascular Surgery

## 2016-05-04 ENCOUNTER — Other Ambulatory Visit: Payer: Medicare Other

## 2016-05-06 ENCOUNTER — Encounter: Payer: Self-pay | Admitting: Vascular Surgery

## 2016-05-06 ENCOUNTER — Ambulatory Visit (INDEPENDENT_AMBULATORY_CARE_PROVIDER_SITE_OTHER): Payer: Medicare Other | Admitting: Vascular Surgery

## 2016-05-06 VITALS — BP 127/80 | HR 60 | Temp 97.0°F | Resp 16 | Ht 72.0 in | Wt 154.0 lb

## 2016-05-06 DIAGNOSIS — I714 Abdominal aortic aneurysm, without rupture, unspecified: Secondary | ICD-10-CM

## 2016-05-06 NOTE — Progress Notes (Signed)
Patient ID: Larry Aguilar, male   DOB: 1933/09/09, 82 y.o.   MRN: [id number]  Reason for Consult: AAA (1 year )   Referred by Cyndi Bender, PA-C  Subjective:     HPI:  Larry Aguilar is a 82 y.o. male here for follow-up of abdominal aortic aneurysm. At last visit greater than a year ago he was a 4.4 cm and today presents with CT scan that is noncontrast given kidney disease and he is a 4.7 cm. His only complaint is pain in his hips with walking inclines. He otherwise has no pain in his legs. He has no new back pain no abdominal pain continues to eat regular diet without limitation. He has known kidney disease that is currently stable. He cannot take aspirin secondary to significant bruising.  Past Medical History:  Diagnosis Date  . AAA (abdominal aortic aneurysm) ([place])   . Hypertension   . Kidney disease    Left kidney mass,  polycystic kidney  . Stroke ([place])   . Syncope    Family History  Problem Relation Age of Onset  . Heart attack Father   . Heart disease Father   . Hyperlipidemia Father   . Hypertension Father   . Cancer Mother     lymphoma   . Heart disease Mother   . Hyperlipidemia Mother   . Hypertension Mother   . Cancer Daughter     Thyroid cancer   . Hypertension Son   . Diabetes Son   . Hypertension Daughter    Past Surgical History:  Procedure Laterality Date  . APPENDECTOMY    . CARDIOVASCULAR STRESS TEST  06/12/2012   Lexiscan, no evidence of meaningful reversible perfusion abnormalities, no evidence of myocardial ischemia, fixed septal defect - related to an artifact.  . FRACTURE SURGERY Left    ORIF femur   . HEMORRHOID SURGERY    . INGUINAL HERNIA REPAIR Bilateral 2014  . SPINE SURGERY     By Dr. Hardin Negus   . THYROID SURGERY     For Goiter removal     Short Social History:  Social History  Substance Use Topics  . Smoking status: Current Every Day Smoker    Packs/day: 2.00  . Smokeless tobacco: Not on file  . Alcohol use No     Allergies  Allergen Reactions  . Penicillins     Current Outpatient Prescriptions  Medication Sig Dispense Refill  . amLODipine (NORVASC) 5 MG tablet Take 5 mg by mouth daily.    Marland Kitchen HYDROcodone-acetaminophen (NORCO/VICODIN) 5-325 MG per tablet Take 1 tablet by mouth 3 (three) times daily as needed for moderate pain.     Marland Kitchen lisinopril (PRINIVIL,ZESTRIL) 20 MG tablet Take 20 mg by mouth daily.    . meclizine (ANTIVERT) 25 MG tablet Take 25 mg by mouth 3 (three) times daily as needed for dizziness.    Marland Kitchen omeprazole (PRILOSEC) 20 MG capsule Take 20 mg by mouth daily.     No current facility-administered medications for this visit.     Review of Systems  Constitutional:  Constitutional negative. Eyes: Eyes negative.  Respiratory: Positive for shortness of breath.  Cardiovascular: Cardiovascular negative.  GI: Gastrointestinal negative.  Musculoskeletal: Musculoskeletal negative.  Skin: Skin negative.  Neurological: Neurological negative. Hematologic: Hematologic/lymphatic negative.  Psychiatric: Psychiatric negative.        Objective:  Objective   Vitals:   05/06/16 1104  BP: 127/80  Pulse: 60  Resp: 16  Temp: 97 F (36.1 C)  TempSrc:  Oral  SpO2: 97%  Weight: 154 lb (69.9 kg)  Height: 6' (1.829 m)   Body mass index is 20.89 kg/m.  Physical Exam  Constitutional: He is oriented to person, place, and time. He appears well-developed.  HENT:  Head: Normocephalic.  Eyes: EOM are normal.  Neck: Normal range of motion.  Cardiovascular:  Pulses:      Femoral pulses are 2+ on the right side, and 2+ on the left side.      Popliteal pulses are 2+ on the right side, and 2+ on the left side.       Dorsalis pedis pulses are 2+ on the right side, and 0 on the left side.       Posterior tibial pulses are 2+ on the right side, and 2+ on the left side.  Abdominal: Soft. He exhibits mass.  Musculoskeletal: Normal range of motion.  Neurological: He is alert and oriented to  person, place, and time.  Skin: Skin is warm and dry.  Psychiatric: He has a normal mood and affect. His behavior is normal. Judgment and thought content normal.    Data: Abdominal aortic aneurysm measuring 4.8 x 4.7 cm in the infrarenal position. It is increased 1 cm in the past 3 years.     Assessment/Plan:  82 year old white male with history of polycystic kidney disease long-term smoker and continues to do so intolerant of aspirin. Since for follow-up of his abdominal aortic aneurysm which is now up to 4.8 cm in the AP dimension. He has no new symptoms back her lower extremity pain. He does have easily palpable popliteal pulses and we will get bilateral lower extremity duplex as well as follow this with a AAA duplex in 1 year. He was counseled on the signs and symptoms of rupture as well as cold leg and he demonstrated good understanding. He refuses to stop smoking and will see [place] in 1 year.     Waynetta Sandy MD [facility]

## 2016-05-10 ENCOUNTER — Encounter: Payer: Self-pay | Admitting: Diagnostic Radiology

## 2016-07-18 NOTE — Addendum Note (Signed)
Addended by: Lianne Cure A on: 07/18/2016 03:49 PM   Modules accepted: Orders

## 2017-04-11 ENCOUNTER — Telehealth: Payer: Self-pay | Admitting: Vascular Surgery

## 2017-04-11 NOTE — Telephone Encounter (Signed)
-----   Message from Mena Goes, RN sent at 04/11/2017  8:33 AM EDT ----- Regarding: move up appt with Dr. Donzetta Matters for large AAA   ----- Message ----- From: Waynetta Sandy, MD Sent: 04/10/2017   5:15 PM To: [facility] Charge Pool  Needs to be seen by me sooner than 9.28 for 5.7cm AAA

## 2017-04-11 NOTE — Telephone Encounter (Signed)
Sched appt 04/26/17 at 8:30. Spoke to daughter.

## 2017-04-24 ENCOUNTER — Encounter: Payer: Self-pay | Admitting: Vascular Surgery

## 2017-04-26 ENCOUNTER — Encounter: Payer: Self-pay | Admitting: Vascular Surgery

## 2017-04-26 ENCOUNTER — Other Ambulatory Visit: Payer: Self-pay

## 2017-04-26 ENCOUNTER — Ambulatory Visit (INDEPENDENT_AMBULATORY_CARE_PROVIDER_SITE_OTHER): Payer: Medicare Other | Admitting: Vascular Surgery

## 2017-04-26 VITALS — BP 176/92 | HR 69 | Temp 97.5°F | Resp 20 | Ht 72.0 in | Wt 152.6 lb

## 2017-04-26 DIAGNOSIS — I724 Aneurysm of artery of lower extremity: Secondary | ICD-10-CM | POA: Diagnosis not present

## 2017-04-26 DIAGNOSIS — I714 Abdominal aortic aneurysm, without rupture, unspecified: Secondary | ICD-10-CM

## 2017-04-26 NOTE — Progress Notes (Signed)
Patient ID: Larry Aguilar, male   DOB: October 02, 1933, 83 y.o.   MRN: [id number]  Reason for Consult: Aneurysm (5.7 cm AAA)   Referred by Cyndi Bender, PA-C  Subjective:     HPI:  Larry Aguilar is a 83 y.o. male with known abdominal aortic aneurysm recently presented to [facility] with right leg pain and bulge. There he was found to have enlarged abdominal aortic aneurysm as well as an SFA aneurysm. 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 He has known polycystic kidney disease as well as a history of a stroke but does not have any coronary artery disease that he knows of. His last stress test was several years ago. He is a current every day smoker and has been such that she was very young. He does have hypertension controlled on 2 medications does not take any blood thinners. He has not had any new back or abdominal pain. His limitation to walking is hip pain he has no claudication symptoms. He does not have rest pain or tissue loss in his feet.  Past Medical History:  Diagnosis Date  . AAA (abdominal aortic aneurysm) ([place])   . Hypertension   . Kidney disease    Left kidney mass,  polycystic kidney  . Stroke ([place])   . Syncope    Family History  Problem Relation Age of Onset  . Heart attack Father   . Heart disease Father   . Hyperlipidemia Father   . Hypertension Father   . Cancer Mother        lymphoma   . Heart disease Mother   . Hyperlipidemia Mother   . Hypertension Mother   . Cancer Daughter        Thyroid cancer   . Hypertension Son   . Diabetes Son   . Hypertension Daughter    Past Surgical History:  Procedure Laterality Date  . APPENDECTOMY    . CARDIOVASCULAR STRESS TEST  06/12/2012   Lexiscan, no evidence of meaningful reversible perfusion abnormalities, no evidence of myocardial ischemia, fixed septal defect - related to an artifact.  . FRACTURE SURGERY Left    ORIF femur   . HEMORRHOID SURGERY    . INGUINAL HERNIA REPAIR Bilateral 2014  . SPINE  SURGERY     By Dr. Hardin Negus   . THYROID SURGERY     For Goiter removal     Short Social History:  Social History  Substance Use Topics  . Smoking status: Current Every Day Smoker    Packs/day: 2.00  . Smokeless tobacco: Never Used  . Alcohol use No    Allergies  Allergen Reactions  . Penicillins     Current Outpatient Prescriptions  Medication Sig Dispense Refill  . amLODipine (NORVASC) 5 MG tablet Take 5 mg by mouth daily.    Marland Kitchen aspirin 81 MG tablet Take 81 mg by mouth daily.    Marland Kitchen lisinopril (PRINIVIL,ZESTRIL) 20 MG tablet Take 20 mg by mouth daily.    Marland Kitchen HYDROcodone-acetaminophen (NORCO/VICODIN) 5-325 MG per tablet Take 1 tablet by mouth 3 (three) times daily as needed for moderate pain.     . meclizine (ANTIVERT) 25 MG tablet Take 25 mg by mouth 3 (three) times daily as needed for dizziness.    Marland Kitchen omeprazole (PRILOSEC) 20 MG capsule Take 20 mg by mouth daily.     No current facility-administered medications for this visit.     Review of Systems  Constitutional:  Constitutional negative. HENT: HENT negative.  Eyes: Eyes  negative.  Respiratory: Respiratory negative.  Cardiovascular: Cardiovascular negative.  GI: Gastrointestinal negative.  Musculoskeletal: Positive for leg pain and joint pain.  Neurological: Neurological negative. Hematologic: Hematologic/lymphatic negative.  Psychiatric: Psychiatric negative.        Objective:  Objective   Vitals:   04/26/17 0841 04/26/17 0842  BP: (!) 176/92 (!) 176/92  Pulse: 69   Resp: 20   Temp: (!) 97.5 F (36.4 C)   TempSrc: Oral   SpO2: 98%   Weight: 152 lb 9.6 oz (69.2 kg)   Height: 6' (1.829 m)    Body mass index is 20.7 kg/m.  Physical Exam  Constitutional: He appears well-developed.  HENT:  Head: Normocephalic.  Eyes: Pupils are equal, round, and reactive to light.  Neck: Normal range of motion. No thyromegaly present.  Cardiovascular:  Pulses:      Radial pulses are 2+ on the right side, and 2+ on the  left side.       Femoral pulses are 3+ on the right side, and 3+ on the left side.      Popliteal pulses are 2+ on the right side, and 2+ on the left side.       Posterior tibial pulses are 2+ on the right side, and 2+ on the left side.  Pulmonary/Chest: Effort normal.  Abdominal: Soft. He exhibits mass.  Musculoskeletal: Normal range of motion.  Skin: Skin is warm and dry.  Psychiatric: He has a normal mood and affect. His behavior is normal. Thought content normal.    Data:      Assessment/Plan:     83 year old male with an abdominal aortic aneurysm that has expanded nearly a centimeter and less than 1 year. It is just a renal in nature although there is some landing zone below the renal arteries although it is diseased. From that standpoint we will attempt to repair with endovascular repair using Endo anchors. I discussed with him that if this is not possible we will need to place stents into his bilateral renal arteries from the left axillary artery approach and extended the aneurysmal repair cephalad. The day prior to this we will also plan stenting of his right popliteal and SFA arteries with covered stents to exclude his aneurysm repair. He does not have aneurysms in the left lower extremity that I can see from CT scan where repair at this time. He will be at significant risk of cardiopulmonary dysfunction but I do not think proceeding with further workup at this time is necessary given the expansion of the abdominal aortic aneurysm pain is greatest threat to life. We did discuss the high-risk nature of this procedure including renal failure and need for prolonged intubation risk of stroke risk of myocardial infarction and death. He demonstrates good understanding in the presence of his son and is willing to proceed in the next 2 weeks. If he has any acute worsening of abdominal, back, lower extremity pain he will presented emergently to the emergency department he can be evaluated at that  time.     Larry Sandy MD [facility]

## 2017-05-09 ENCOUNTER — Other Ambulatory Visit: Payer: Self-pay

## 2017-05-10 ENCOUNTER — Encounter (HOSPITAL_COMMUNITY)
Admission: RE | Admit: 2017-05-10 | Discharge: 2017-05-10 | Disposition: A | Discharge status: Home / Self Care | Payer: Medicare Other | Source: Ambulatory Visit | Attending: Vascular Surgery | Admitting: Vascular Surgery

## 2017-05-10 ENCOUNTER — Encounter (HOSPITAL_COMMUNITY): Payer: Self-pay

## 2017-05-10 ENCOUNTER — Ambulatory Visit (HOSPITAL_COMMUNITY): Admit: 2017-05-10 | Payer: Medicare Other | Admitting: Vascular Surgery

## 2017-05-10 HISTORY — DX: Chronic kidney disease, stage 3 (moderate): N18.3

## 2017-05-10 HISTORY — DX: Chronic kidney disease, stage 3 unspecified: N18.30

## 2017-05-10 LAB — SURGICAL PCR SCREEN
MRSA, PCR: NEGATIVE
Staphylococcus aureus: NEGATIVE

## 2017-05-10 LAB — COMPREHENSIVE METABOLIC PANEL
ALBUMIN: 3.7 g/dL (ref 3.5–5.0)
ALT: 10 U/L — AB (ref 17–63)
ANION GAP: 8 (ref 5–15)
AST: 8 U/L — ABNORMAL LOW (ref 15–41)
Alkaline Phosphatase: 62 U/L (ref 38–126)
BUN: 30 mg/dL — ABNORMAL HIGH (ref 6–20)
CHLORIDE: 112 mmol/L — AB (ref 101–111)
CO2: 16 mmol/L — AB (ref 22–32)
Calcium: 8.9 mg/dL (ref 8.9–10.3)
Creatinine, Ser: 1.78 mg/dL — ABNORMAL HIGH (ref 0.61–1.24)
GFR calc non Af Amer: 34 mL/min — ABNORMAL LOW (ref >60)
GFR, EST AFRICAN AMERICAN: 39 mL/min — AB (ref >60)
GLUCOSE: 96 mg/dL (ref 65–99)
Potassium: 4.1 mmol/L (ref 3.5–5.1)
SODIUM: 136 mmol/L (ref 135–145)
Total Bilirubin: 0.9 mg/dL (ref 0.3–1.2)
Total Protein: 7 g/dL (ref 6.5–8.1)

## 2017-05-10 LAB — URINALYSIS, ROUTINE W REFLEX MICROSCOPIC
Bacteria, UA: NONE SEEN
Bilirubin Urine: NEGATIVE
Glucose, UA: NEGATIVE mg/dL
Hgb urine dipstick: NEGATIVE
Ketones, ur: NEGATIVE mg/dL
Leukocytes, UA: NEGATIVE
Nitrite: NEGATIVE
Protein, ur: 30 mg/dL — AB
Specific Gravity, Urine: 1.012 (ref 1.005–1.030)
Squamous Epithelial / LPF: NONE SEEN
pH: 5 (ref 5.0–8.0)

## 2017-05-10 LAB — BLOOD GAS, ARTERIAL
Acid-base deficit: 5 mmol/L — ABNORMAL HIGH (ref 0.0–2.0)
Bicarbonate: 18.9 mmol/L — ABNORMAL LOW (ref 20.0–28.0)
Drawn by: 470591
FIO2: 21
O2 SAT: 98.6 %
PATIENT TEMPERATURE: 98.6
PO2 ART: 149 mmHg — AB (ref 83.0–108.0)
pCO2 arterial: 31.5 mmHg — ABNORMAL LOW (ref 32.0–48.0)
pH, Arterial: 7.397 (ref 7.350–7.450)

## 2017-05-10 LAB — CBC
HCT: 38.4 % — ABNORMAL LOW (ref 39.0–52.0)
HEMOGLOBIN: 12.9 g/dL — AB (ref 13.0–17.0)
MCH: 31.9 pg (ref 26.0–34.0)
MCHC: 33.6 g/dL (ref 30.0–36.0)
MCV: 95 fL (ref 78.0–100.0)
PLATELETS: 132 10*3/uL — AB (ref 150–400)
RBC: 4.04 MIL/uL — AB (ref 4.22–5.81)
RDW: 13.1 % (ref 11.5–15.5)
WBC: 6 10*3/uL (ref 4.0–10.5)

## 2017-05-10 LAB — APTT: aPTT: 26 seconds (ref 24–36)

## 2017-05-10 LAB — PROTIME-INR
INR: 1.02
Prothrombin Time: 13.3 seconds (ref 11.4–15.2)

## 2017-05-10 LAB — ABO/RH: ABO/RH(D): O POS

## 2017-05-10 SURGERY — ABDOMINAL AORTOGRAM W/LOWER EXTREMITY
Anesthesia: LOCAL

## 2017-05-10 NOTE — Progress Notes (Signed)
Anesthesia Chart Review:  Pt is an 83 year old male scheduled for abdominal aortic endovascular stent graft with endoanchor, possible B renal artery stent from L axillary approach, aortogram with R lower extremity stent, coil embolization of inferior mesenteric artery on 05/11/2017 with Servando Snare, MD  - PCP is Cyndi Bender, PA  PMH includes:  AAA, HTN, stroke, syncope, CKD (stage III). Current smoker. BMI 20.5  Medications include: amlodipine, ASA 81mg , lisinopril  BP (!) 178/78   Pulse (!) 52   Temp (!) 36.3 C   Resp 18   Ht 6' (1.829 m)   Wt 151 lb 8 oz (68.7 kg)   SpO2 99%   BMI 20.55 kg/m   Preoperative labs reviewed.  Cr 1.78, BUN 30. Prior Cr 1.66 on 01/17/17 at PCP's office.   EKG 05/10/17: Sinus bradycardia (56 bpm) with 1st degree A-V block with PACs. LAD. Low voltage QRS. Septal infarct, age undetermined  Nuclear stress test 06/12/12: Normal myocardial perfusion study. No prior study available for comparison. No significant ishcemia demonstrated. This is a low risk scan.   If no changes, I anticipate pt can proceed with surgery as scheduled.   Willeen Cass, FNP-BC [facility] Stay [facility]/Anesthesiology Phone: [phone] 05/10/2017 4:36 PM

## 2017-05-10 NOTE — Pre-Procedure Instructions (Addendum)
SHADMAN TOZZI  05/10/2017      [address] [place] [address] Phone: [phone] Fax: [phone]    Your procedure is scheduled on May 11, 2017.  Report to [facility] Admitting at 530 AM.  Call this number if you have problems the morning of surgery:  [phone]   Remember:  Do not eat food or drink liquids after midnight.  Take these medicines the morning of surgery with A SIP OF WATER amlodipine (norvasc), meclizine (antivert)-if needed  STOP taking any Aspirin, Aleve, Naproxen, Ibuprofen, Motrin, Advil, Goody's, BC's, all herbal medications, fish oil, and all vitamins   Do not wear jewelry, make-up or nail polish.  Do not wear lotions, powders, or perfumes, or deoderant.  Men may shave face and neck.  Do not bring valuables to the hospital.  [facility] is not responsible for any belongings or valuables.  Contacts, dentures or bridgework may not be worn into surgery.  Leave your suitcase in the car.  After surgery it may be brought to your room.  For patients admitted to the hospital, discharge time will be determined by your treatment team.  Patients discharged the day of surgery will not be allowed to drive home.   Special instructions:   [place]- Preparing For Surgery  Before surgery, you can play an important role. Because skin is not sterile, your skin needs to be as free of germs as possible. You can reduce the number of germs on your skin by washing with CHG (chlorahexidine gluconate) Soap before surgery.  CHG is an antiseptic cleaner which kills germs and bonds with the skin to continue killing germs even after washing.  Please do not use if you have an allergy to CHG or antibacterial soaps. If your skin becomes reddened/irritated stop using the CHG.  Do not shave (including legs and underarms) for at least 48 hours prior to first CHG shower. It is OK to  shave your face.  Please follow these instructions carefully.   1. Shower the NIGHT BEFORE SURGERY and the MORNING OF SURGERY with CHG.   2. If you chose to wash your hair, wash your hair first as usual with your normal shampoo.  3. After you shampoo, rinse your hair and body thoroughly to remove the shampoo.  4. Use CHG as you would any other liquid soap. You can apply CHG directly to the skin and wash gently with a scrungie or a clean washcloth.   5. Apply the CHG Soap to your body ONLY FROM THE NECK DOWN.  Do not use on open wounds or open sores. Avoid contact with your eyes, ears, mouth and genitals (private parts). Wash genitals (private parts) with your normal soap.  6. Wash thoroughly, paying special attention to the area where your surgery will be performed.  7. Thoroughly rinse your body with warm water from the neck down.  8. DO NOT shower/wash with your normal soap after using and rinsing off the CHG Soap.  9. Pat yourself dry with a CLEAN TOWEL.   10. Wear CLEAN PAJAMAS   11. Place CLEAN SHEETS on your bed the night of your first shower and DO NOT SLEEP WITH PETS.    Day of Surgery: Do not apply any deodorants/lotions. Please wear clean clothes to the hospital/surgery center.     Please read over the following fact sheets that you  were given. Pain Booklet, Coughing and Deep Breathing, MRSA Information and Surgical Site Infection Prevention

## 2017-05-10 NOTE — Progress Notes (Addendum)
[facility]:BULAGT Tobie Lords NP  Cardiologist: pt denies   EKG: pt denies past year, done today 05/10/17  Stress test: 5+ years  ECHO: pt denies ever  Cardiac Cath: pt denies ever   Chest x-ray: pt denies last year-no recent respiratory infections/complications  Chart given to anesthesia to review after receiving lab results and EKG 05/10/17 1135

## 2017-05-11 ENCOUNTER — Encounter (HOSPITAL_COMMUNITY)
Admission: RE | Disposition: A | Discharge status: Home / Self Care | Payer: Self-pay | Source: Home / Self Care | Attending: Vascular Surgery

## 2017-05-11 ENCOUNTER — Encounter (HOSPITAL_COMMUNITY): Payer: Self-pay | Admitting: General Practice

## 2017-05-11 ENCOUNTER — Inpatient Hospital Stay (HOSPITAL_COMMUNITY): Payer: Medicare Other

## 2017-05-11 ENCOUNTER — Other Ambulatory Visit: Payer: Self-pay

## 2017-05-11 ENCOUNTER — Inpatient Hospital Stay (HOSPITAL_COMMUNITY)
Admission: RE | Admit: 2017-05-11 | Discharge: 2017-05-12 | DRG: 269 | Disposition: A | Discharge status: Home / Self Care | Payer: Medicare Other | Attending: Vascular Surgery | Admitting: Vascular Surgery

## 2017-05-11 ENCOUNTER — Inpatient Hospital Stay (HOSPITAL_COMMUNITY): Payer: Medicare Other | Admitting: Emergency Medicine

## 2017-05-11 ENCOUNTER — Inpatient Hospital Stay (HOSPITAL_COMMUNITY): Payer: Medicare Other | Admitting: Anesthesiology

## 2017-05-11 DIAGNOSIS — I129 Hypertensive chronic kidney disease with stage 1 through stage 4 chronic kidney disease, or unspecified chronic kidney disease: Secondary | ICD-10-CM | POA: Diagnosis present

## 2017-05-11 DIAGNOSIS — Z95828 Presence of other vascular implants and grafts: Secondary | ICD-10-CM

## 2017-05-11 DIAGNOSIS — I724 Aneurysm of artery of lower extremity: Secondary | ICD-10-CM | POA: Diagnosis present

## 2017-05-11 DIAGNOSIS — Z992 Dependence on renal dialysis: Secondary | ICD-10-CM

## 2017-05-11 DIAGNOSIS — F17210 Nicotine dependence, cigarettes, uncomplicated: Secondary | ICD-10-CM | POA: Diagnosis present

## 2017-05-11 DIAGNOSIS — Z880 Allergy status to penicillin: Secondary | ICD-10-CM | POA: Diagnosis not present

## 2017-05-11 DIAGNOSIS — N183 Chronic kidney disease, stage 3 (moderate): Secondary | ICD-10-CM | POA: Diagnosis present

## 2017-05-11 DIAGNOSIS — Q613 Polycystic kidney, unspecified: Secondary | ICD-10-CM | POA: Diagnosis not present

## 2017-05-11 DIAGNOSIS — Z833 Family history of diabetes mellitus: Secondary | ICD-10-CM | POA: Diagnosis not present

## 2017-05-11 DIAGNOSIS — Z7982 Long term (current) use of aspirin: Secondary | ICD-10-CM | POA: Diagnosis not present

## 2017-05-11 DIAGNOSIS — I714 Abdominal aortic aneurysm, without rupture, unspecified: Secondary | ICD-10-CM | POA: Diagnosis present

## 2017-05-11 DIAGNOSIS — Z8673 Personal history of transient ischemic attack (TIA), and cerebral infarction without residual deficits: Secondary | ICD-10-CM | POA: Diagnosis not present

## 2017-05-11 DIAGNOSIS — M79604 Pain in right leg: Secondary | ICD-10-CM | POA: Diagnosis present

## 2017-05-11 DIAGNOSIS — Z8249 Family history of ischemic heart disease and other diseases of the circulatory system: Secondary | ICD-10-CM | POA: Diagnosis not present

## 2017-05-11 DIAGNOSIS — Z48812 Encounter for surgical aftercare following surgery on the circulatory system: Secondary | ICD-10-CM

## 2017-05-11 DIAGNOSIS — Z79899 Other long term (current) drug therapy: Secondary | ICD-10-CM

## 2017-05-11 HISTORY — PX: AORTOGRAM: SHX6300

## 2017-05-11 HISTORY — PX: ABDOMINAL AORTIC ENDOVASCULAR STENT GRAFT: SHX5707

## 2017-05-11 LAB — CBC
HCT: 35.3 % — ABNORMAL LOW (ref 39.0–52.0)
HEMATOCRIT: 33.4 % — AB (ref 39.0–52.0)
Hemoglobin: 11.2 g/dL — ABNORMAL LOW (ref 13.0–17.0)
Hemoglobin: 11.9 g/dL — ABNORMAL LOW (ref 13.0–17.0)
MCH: 31.9 pg (ref 26.0–34.0)
MCH: 32 pg (ref 26.0–34.0)
MCHC: 33.5 g/dL (ref 30.0–36.0)
MCHC: 33.7 g/dL (ref 30.0–36.0)
MCV: 95.2 fL (ref 78.0–100.0)
MCV: 94.9 fL (ref 78.0–100.0)
PLATELETS: 119 10*3/uL — AB (ref 150–400)
PLATELETS: 127 10*3/uL — AB (ref 150–400)
RBC: 3.51 MIL/uL — ABNORMAL LOW (ref 4.22–5.81)
RBC: 3.72 MIL/uL — ABNORMAL LOW (ref 4.22–5.81)
RDW: 12.9 % (ref 11.5–15.5)
RDW: 12.7 % (ref 11.5–15.5)
WBC: 5.1 10*3/uL (ref 4.0–10.5)
WBC: 8.2 10*3/uL (ref 4.0–10.5)

## 2017-05-11 LAB — BASIC METABOLIC PANEL
ANION GAP: 8 (ref 5–15)
BUN: 27 mg/dL — ABNORMAL HIGH (ref 6–20)
CALCIUM: 8.2 mg/dL — AB (ref 8.9–10.3)
CO2: 19 mmol/L — AB (ref 22–32)
CREATININE: 1.63 mg/dL — AB (ref 0.61–1.24)
Chloride: 110 mmol/L (ref 101–111)
GFR calc Af Amer: 44 mL/min — ABNORMAL LOW (ref >60)
GFR, EST NON AFRICAN AMERICAN: 38 mL/min — AB (ref >60)
GLUCOSE: 116 mg/dL — AB (ref 65–99)
Potassium: 4.4 mmol/L (ref 3.5–5.1)
Sodium: 137 mmol/L (ref 135–145)

## 2017-05-11 LAB — CREATININE, SERUM
CREATININE: 1.92 mg/dL — AB (ref 0.61–1.24)
GFR calc Af Amer: 36 mL/min — ABNORMAL LOW (ref >60)
GFR calc non Af Amer: 31 mL/min — ABNORMAL LOW (ref >60)

## 2017-05-11 LAB — MAGNESIUM: MAGNESIUM: 1.8 mg/dL (ref 1.7–2.4)

## 2017-05-11 LAB — PROTIME-INR
INR: 1.13
Prothrombin Time: 14.4 seconds (ref 11.4–15.2)

## 2017-05-11 LAB — APTT: aPTT: 55 seconds — ABNORMAL HIGH (ref 24–36)

## 2017-05-11 LAB — PREPARE RBC (CROSSMATCH)

## 2017-05-11 SURGERY — INSERTION, ENDOVASCULAR STENT GRAFT, AORTA, ABDOMINAL
Anesthesia: General

## 2017-05-11 MED ORDER — MAGNESIUM SULFATE 2 GM/50ML IV SOLN
2.0000 g | Freq: Every day | INTRAVENOUS | Status: DC | PRN
  Filled 2017-05-11: qty 50

## 2017-05-11 MED ORDER — LACTATED RINGERS IV SOLN
INTRAVENOUS | Status: DC | PRN
  Administered 2017-05-11: 07:00:00 via INTRAVENOUS

## 2017-05-11 MED ORDER — PHENOL 1.4 % MT LIQD: 1.0000 | OROMUCOSAL | Status: DC | PRN

## 2017-05-11 MED ORDER — DEXAMETHASONE SODIUM PHOSPHATE 10 MG/ML IJ SOLN
INTRAMUSCULAR | Status: DC | PRN
  Administered 2017-05-11: 10 mg via INTRAVENOUS

## 2017-05-11 MED ORDER — PROPOFOL 10 MG/ML IV BOLUS
INTRAVENOUS | Status: AC
  Filled 2017-05-11: qty 20

## 2017-05-11 MED ORDER — FENTANYL CITRATE (PF) 100 MCG/2ML IJ SOLN
INTRAMUSCULAR | Status: DC | PRN
  Administered 2017-05-11 (×3): 50 ug via INTRAVENOUS

## 2017-05-11 MED ORDER — SODIUM CHLORIDE 0.9% FLUSH: 10.0000 mL | INTRAVENOUS | Status: DC | PRN

## 2017-05-11 MED ORDER — DEXTROSE 5 % IV SOLN
1.5000 g | Freq: Two times a day (BID) | INTRAVENOUS | Status: AC
  Administered 2017-05-11 – 2017-05-12 (×2): 1.5 g via INTRAVENOUS
  Filled 2017-05-11 (×2): qty 1.5

## 2017-05-11 MED ORDER — CHLORHEXIDINE GLUCONATE 4 % EX LIQD: 60.0000 mL | Freq: Once | CUTANEOUS | Status: DC

## 2017-05-11 MED ORDER — GUAIFENESIN-DM 100-10 MG/5ML PO SYRP: 15.0000 mL | ORAL_SOLUTION | ORAL | Status: DC | PRN

## 2017-05-11 MED ORDER — SUCCINYLCHOLINE CHLORIDE 200 MG/10ML IV SOSY
PREFILLED_SYRINGE | INTRAVENOUS | Status: AC
  Filled 2017-05-11: qty 10

## 2017-05-11 MED ORDER — MECLIZINE HCL 25 MG PO TABS: 25.0000 mg | ORAL_TABLET | Freq: Three times a day (TID) | ORAL | Status: DC | PRN

## 2017-05-11 MED ORDER — HYDRALAZINE HCL 20 MG/ML IJ SOLN
INTRAMUSCULAR | Status: AC
  Administered 2017-05-11: 5 mg via INTRAVENOUS
  Filled 2017-05-11: qty 1

## 2017-05-11 MED ORDER — EPHEDRINE 5 MG/ML INJ
INTRAVENOUS | Status: AC
  Filled 2017-05-11: qty 10

## 2017-05-11 MED ORDER — ALUM & MAG HYDROXIDE-SIMETH 200-200-20 MG/5ML PO SUSP: 15.0000 mL | ORAL | Status: DC | PRN

## 2017-05-11 MED ORDER — LABETALOL HCL 5 MG/ML IV SOLN: 10.0000 mg | INTRAVENOUS | Status: DC | PRN

## 2017-05-11 MED ORDER — GLYCOPYRROLATE 0.2 MG/ML IJ SOLN
INTRAMUSCULAR | Status: DC | PRN
  Administered 2017-05-11: .2 mg via INTRAVENOUS

## 2017-05-11 MED ORDER — PROPOFOL 10 MG/ML IV BOLUS
INTRAVENOUS | Status: DC | PRN
  Administered 2017-05-11: 100 mg via INTRAVENOUS
  Administered 2017-05-11: 50 mg via INTRAVENOUS

## 2017-05-11 MED ORDER — SODIUM CHLORIDE 0.9 % IV SOLN: INTRAVENOUS | Status: DC

## 2017-05-11 MED ORDER — POTASSIUM CHLORIDE CRYS ER 20 MEQ PO TBCR: 20.0000 meq | EXTENDED_RELEASE_TABLET | Freq: Every day | ORAL | Status: DC | PRN

## 2017-05-11 MED ORDER — HEPARIN SODIUM (PORCINE) 1000 UNIT/ML IJ SOLN
INTRAMUSCULAR | Status: DC | PRN
  Administered 2017-05-11: 5000 [IU] via INTRAVENOUS
  Administered 2017-05-11: 7000 [IU] via INTRAVENOUS

## 2017-05-11 MED ORDER — ACETAMINOPHEN 650 MG RE SUPP: 325.0000 mg | RECTAL | Status: DC | PRN

## 2017-05-11 MED ORDER — ONDANSETRON HCL 4 MG/2ML IJ SOLN
INTRAMUSCULAR | Status: AC
  Filled 2017-05-11: qty 2

## 2017-05-11 MED ORDER — LIDOCAINE 2% (20 MG/ML) 5 ML SYRINGE
INTRAMUSCULAR | Status: AC
  Filled 2017-05-11: qty 5

## 2017-05-11 MED ORDER — ASPIRIN EC 81 MG PO TBEC
81.0000 mg | DELAYED_RELEASE_TABLET | Freq: Every day | ORAL | Status: DC
  Administered 2017-05-12: 81 mg via ORAL
  Filled 2017-05-11: qty 1

## 2017-05-11 MED ORDER — PANTOPRAZOLE SODIUM 40 MG PO TBEC
40.0000 mg | DELAYED_RELEASE_TABLET | Freq: Every day | ORAL | Status: DC
  Administered 2017-05-11: 40 mg via ORAL
  Filled 2017-05-11 (×2): qty 1

## 2017-05-11 MED ORDER — VANCOMYCIN HCL IN DEXTROSE 1-5 GM/200ML-% IV SOLN
INTRAVENOUS | Status: AC
  Filled 2017-05-11: qty 200

## 2017-05-11 MED ORDER — DOCUSATE SODIUM 100 MG PO CAPS
100.0000 mg | ORAL_CAPSULE | Freq: Every day | ORAL | Status: DC
  Filled 2017-05-11: qty 1

## 2017-05-11 MED ORDER — LIDOCAINE HCL (CARDIAC) 20 MG/ML IV SOLN
INTRAVENOUS | Status: DC | PRN
  Administered 2017-05-11: 100 mg via INTRAVENOUS

## 2017-05-11 MED ORDER — PHENYLEPHRINE 40 MCG/ML (10ML) SYRINGE FOR IV PUSH (FOR BLOOD PRESSURE SUPPORT)
PREFILLED_SYRINGE | INTRAVENOUS | Status: AC
  Filled 2017-05-11: qty 10

## 2017-05-11 MED ORDER — DEXAMETHASONE SODIUM PHOSPHATE 10 MG/ML IJ SOLN
INTRAMUSCULAR | Status: AC
  Filled 2017-05-11: qty 1

## 2017-05-11 MED ORDER — LACTATED RINGERS IV SOLN
INTRAVENOUS | Status: DC | PRN
  Administered 2017-05-11: 08:00:00 via INTRAVENOUS

## 2017-05-11 MED ORDER — LISINOPRIL 10 MG PO TABS
20.0000 mg | ORAL_TABLET | Freq: Every day | ORAL | Status: DC
  Administered 2017-05-12: 20 mg via ORAL
  Filled 2017-05-11: qty 2

## 2017-05-11 MED ORDER — ACETAMINOPHEN 325 MG PO TABS
325.0000 mg | ORAL_TABLET | ORAL | Status: DC | PRN
  Administered 2017-05-12 (×2): 650 mg via ORAL
  Filled 2017-05-11 (×2): qty 2

## 2017-05-11 MED ORDER — ONDANSETRON HCL 4 MG/2ML IJ SOLN
INTRAMUSCULAR | Status: DC | PRN
  Administered 2017-05-11: 4 mg via INTRAVENOUS

## 2017-05-11 MED ORDER — ROCURONIUM BROMIDE 100 MG/10ML IV SOLN
INTRAVENOUS | Status: DC | PRN
  Administered 2017-05-11: 50 mg via INTRAVENOUS
  Administered 2017-05-11: 20 mg via INTRAVENOUS
  Administered 2017-05-11: 10 mg via INTRAVENOUS
  Administered 2017-05-11: 20 mg via INTRAVENOUS

## 2017-05-11 MED ORDER — SODIUM CHLORIDE 0.9 % IV SOLN
INTRAVENOUS | Status: DC | PRN
  Administered 2017-05-11 (×2): 500 mL

## 2017-05-11 MED ORDER — SODIUM CHLORIDE 0.9 % IV SOLN: 500.0000 mL | Freq: Once | INTRAVENOUS | Status: DC | PRN

## 2017-05-11 MED ORDER — 0.9 % SODIUM CHLORIDE (POUR BTL) OPTIME
TOPICAL | Status: DC | PRN
  Administered 2017-05-11: 1000 mL

## 2017-05-11 MED ORDER — SUGAMMADEX SODIUM 200 MG/2ML IV SOLN
INTRAVENOUS | Status: DC | PRN
  Administered 2017-05-11: 100 mg via INTRAVENOUS

## 2017-05-11 MED ORDER — PROTAMINE SULFATE 10 MG/ML IV SOLN
INTRAVENOUS | Status: DC | PRN
  Administered 2017-05-11: 25 mg via INTRAVENOUS

## 2017-05-11 MED ORDER — ONDANSETRON HCL 4 MG/2ML IJ SOLN: 4.0000 mg | Freq: Four times a day (QID) | INTRAMUSCULAR | Status: DC | PRN

## 2017-05-11 MED ORDER — IODIXANOL 320 MG/ML IV SOLN
INTRAVENOUS | Status: DC | PRN
  Administered 2017-05-11: 137 mL via INTRA_ARTERIAL

## 2017-05-11 MED ORDER — HYDRALAZINE HCL 20 MG/ML IJ SOLN
5.0000 mg | INTRAMUSCULAR | Status: AC | PRN
  Administered 2017-05-11 (×2): 5 mg via INTRAVENOUS

## 2017-05-11 MED ORDER — FENTANYL CITRATE (PF) 100 MCG/2ML IJ SOLN
25.0000 ug | INTRAMUSCULAR | Status: DC | PRN
  Administered 2017-05-11: 50 ug via INTRAVENOUS

## 2017-05-11 MED ORDER — FENTANYL CITRATE (PF) 250 MCG/5ML IJ SOLN
INTRAMUSCULAR | Status: AC
  Filled 2017-05-11: qty 5

## 2017-05-11 MED ORDER — PHENYLEPHRINE HCL 10 MG/ML IJ SOLN
INTRAVENOUS | Status: DC | PRN
  Administered 2017-05-11: 40 ug/min via INTRAVENOUS

## 2017-05-11 MED ORDER — HEPARIN SODIUM (PORCINE) 1000 UNIT/ML IJ SOLN
INTRAMUSCULAR | Status: AC
  Filled 2017-05-11: qty 1

## 2017-05-11 MED ORDER — PROTAMINE SULFATE 10 MG/ML IV SOLN
INTRAVENOUS | Status: AC
  Filled 2017-05-11: qty 5

## 2017-05-11 MED ORDER — ROCURONIUM BROMIDE 10 MG/ML (PF) SYRINGE
PREFILLED_SYRINGE | INTRAVENOUS | Status: AC
  Filled 2017-05-11: qty 5

## 2017-05-11 MED ORDER — HEPARIN SODIUM (PORCINE) 5000 UNIT/ML IJ SOLN
5000.0000 [IU] | Freq: Three times a day (TID) | INTRAMUSCULAR | Status: DC
  Administered 2017-05-11 – 2017-05-12 (×2): 5000 [IU] via SUBCUTANEOUS
  Filled 2017-05-11 (×2): qty 1

## 2017-05-11 MED ORDER — METOPROLOL TARTRATE 5 MG/5ML IV SOLN: 2.0000 mg | INTRAVENOUS | Status: DC | PRN

## 2017-05-11 MED ORDER — OXYCODONE-ACETAMINOPHEN 5-325 MG PO TABS: 1.0000 | ORAL_TABLET | Freq: Four times a day (QID) | ORAL | 0 refills | Status: DC | PRN

## 2017-05-11 MED ORDER — FENTANYL CITRATE (PF) 100 MCG/2ML IJ SOLN
INTRAMUSCULAR | Status: AC
  Administered 2017-05-11: 50 ug via INTRAVENOUS
  Filled 2017-05-11: qty 2

## 2017-05-11 MED ORDER — VANCOMYCIN HCL IN DEXTROSE 1-5 GM/200ML-% IV SOLN
1000.0000 mg | INTRAVENOUS | Status: AC
  Administered 2017-05-11: 1000 mg via INTRAVENOUS

## 2017-05-11 MED ORDER — SUGAMMADEX SODIUM 200 MG/2ML IV SOLN
INTRAVENOUS | Status: AC
  Filled 2017-05-11: qty 2

## 2017-05-11 MED ORDER — AMLODIPINE BESYLATE 5 MG PO TABS
2.5000 mg | ORAL_TABLET | Freq: Every day | ORAL | Status: DC
  Administered 2017-05-12: 2.5 mg via ORAL
  Filled 2017-05-11: qty 1

## 2017-05-11 SURGICAL SUPPLY — 95 items
BAG SNAP BAND KOVER 36X36 (MISCELLANEOUS) ×3 IMPLANT
BALLN MUSTANG 7X150X135 (BALLOONS) ×3
BALLOON MUSTANG 7X150X135 (BALLOONS) ×1 IMPLANT
BLADE CLIPPER SURG (BLADE) ×3 IMPLANT
BLADE SURG 11 STRL SS (BLADE) ×3 IMPLANT
CANISTER SUCT 3000ML PPV (MISCELLANEOUS) ×3 IMPLANT
CATH ANGIO 5F BER 65CM (CATHETERS) IMPLANT
CATH ANGIO 5F BER2 65CM (CATHETERS) ×3 IMPLANT
CATH BEACON 5.038 65CM KMP-01 (CATHETERS) IMPLANT
CATH CROSS OVER TEMPO 5F (CATHETERS) ×3 IMPLANT
CATH OMNI FLUSH .035X70CM (CATHETERS) ×3 IMPLANT
CATH QUICKCROSS .035X135CM (MICROCATHETER) ×3 IMPLANT
CATH QUICKCROSS SUPP .035X90CM (MICROCATHETER) IMPLANT
CLIP 12FR CASSETTE HELI-FX (Vascular Products) ×3 IMPLANT
CLIP CASSETTE HELI-FX 12MM (Vascular Products) ×1 IMPLANT
COVER BACK TABLE 80X110 HD (DRAPES) ×6 IMPLANT
COVER DOME SNAP 22 D (MISCELLANEOUS) ×3 IMPLANT
COVER PROBE W GEL 5X96 (DRAPES) ×3 IMPLANT
COVER SURGICAL LIGHT HANDLE (MISCELLANEOUS) ×3 IMPLANT
DERMABOND ADVANCED (GAUZE/BANDAGES/DRESSINGS) ×2
DERMABOND ADVANCED .7 DNX12 (GAUZE/BANDAGES/DRESSINGS) ×1 IMPLANT
DEVICE CLOSURE PERCLS PRGLD 6F (VASCULAR PRODUCTS) ×5 IMPLANT
DEVICE TORQUE KENDALL .025-038 (MISCELLANEOUS) ×3 IMPLANT
DRAPE FEMORAL ANGIO 80X135IN (DRAPES) ×3 IMPLANT
DRAPE ZERO GRAVITY STERILE (DRAPES) IMPLANT
DRSG TEGADERM 2-3/8X2-3/4 SM (GAUZE/BANDAGES/DRESSINGS) ×6 IMPLANT
DRYSEAL FLEXSHEATH 18FR 33CM (SHEATH) ×4
ELECT REM PT RETURN 9FT ADLT (ELECTROSURGICAL) ×6
ELECTRODE REM PT RTRN 9FT ADLT (ELECTROSURGICAL) ×2 IMPLANT
EXCLUDER AORTIC EXTEND 32X4.5 (Vascular Products) ×3 IMPLANT
EXCLUDER TNK LEG 31MX14X15 (Endovascular Graft) ×1 IMPLANT
EXCLUDER TRUNK LEG 31MX14X15 (Endovascular Graft) ×3 IMPLANT
GAUZE SPONGE 2X2 8PLY STRL LF (GAUZE/BANDAGES/DRESSINGS) ×2 IMPLANT
GAUZE SPONGE 4X4 16PLY XRAY LF (GAUZE/BANDAGES/DRESSINGS) ×6 IMPLANT
GLOVE BIO SURGEON STRL SZ 6.5 (GLOVE) ×2 IMPLANT
GLOVE BIO SURGEON STRL SZ7.5 (GLOVE) ×3 IMPLANT
GLOVE BIO SURGEONS STRL SZ 6.5 (GLOVE) ×1
GLOVE BIOGEL PI IND STRL 6.5 (GLOVE) ×3 IMPLANT
GLOVE BIOGEL PI IND STRL 7.5 (GLOVE) ×1 IMPLANT
GLOVE BIOGEL PI INDICATOR 6.5 (GLOVE) ×6
GLOVE BIOGEL PI INDICATOR 7.5 (GLOVE) ×2
GLOVE SKINSENSE NS SZ7.5 (GLOVE) ×2
GLOVE SKINSENSE STRL SZ7.5 (GLOVE) ×1 IMPLANT
GOWN STRL REUS W/ TWL LRG LVL3 (GOWN DISPOSABLE) ×2 IMPLANT
GOWN STRL REUS W/ TWL XL LVL3 (GOWN DISPOSABLE) ×1 IMPLANT
GOWN STRL REUS W/TWL LRG LVL3 (GOWN DISPOSABLE) ×4
GOWN STRL REUS W/TWL XL LVL3 (GOWN DISPOSABLE) ×2
GRAFT BALLN CATH 65CM (STENTS) ×1 IMPLANT
GUIDE HELI-FX 28MM (VASCULAR PRODUCTS) ×3 IMPLANT
GUIDEWIRE ANGLED .035X150CM (WIRE) IMPLANT
GUIDEWIRE ANGLED .035X260CM (WIRE) ×3 IMPLANT
KIT BASIN OR (CUSTOM PROCEDURE TRAY) ×3 IMPLANT
KIT ENCORE 26 ADVANTAGE (KITS) ×3 IMPLANT
KIT ROOM TURNOVER OR (KITS) ×3 IMPLANT
LEG CONTRALATERAL 18X11.5 (Endovascular Graft) ×3 IMPLANT
NEEDLE PERC 18GX7CM (NEEDLE) ×3 IMPLANT
NS IRRIG 1000ML POUR BTL (IV SOLUTION) ×3 IMPLANT
PACK ENDOVASCULAR (PACKS) ×3 IMPLANT
PACK PERIPHERAL VASCULAR (CUSTOM PROCEDURE TRAY) IMPLANT
PAD ARMBOARD 7.5X6 YLW CONV (MISCELLANEOUS) ×6 IMPLANT
PERCLOSE PROGLIDE 6F (VASCULAR PRODUCTS) ×15
SET MICROPUNCTURE 5F STIFF (MISCELLANEOUS) ×3 IMPLANT
SHEATH AVANTI 11CM 5FR (MISCELLANEOUS) IMPLANT
SHEATH AVANTI 11CM 8FR (MISCELLANEOUS) IMPLANT
SHEATH BRITE TIP 8FR 23CM (MISCELLANEOUS) ×3 IMPLANT
SHEATH DRYSEAL FLEX 18FR 33CM (SHEATH) ×2 IMPLANT
SHEATH PINNACLE 8F 10CM (SHEATH) ×3 IMPLANT
SHEATH PINNACLE ST 7F 45CM (SHEATH) ×3 IMPLANT
SHIELD RADPAD SCOOP 12X17 (MISCELLANEOUS) ×9 IMPLANT
SPONGE GAUZE 2X2 STER 10/PKG (GAUZE/BANDAGES/DRESSINGS) ×4
STENT GRAFT BALLN CATH 65CM (STENTS) ×2
STENT VIABAHN 8X150X120 (Permanent Stent) ×2 IMPLANT
STENT VIABAHN 8X15X120 7FR (Permanent Stent) ×1 IMPLANT
STENT VIABAHN 8X250X120 (Permanent Stent) ×3 IMPLANT
STOPCOCK MORSE 400PSI 3WAY (MISCELLANEOUS) ×3 IMPLANT
SUT ETHILON 3 0 PS 1 (SUTURE) IMPLANT
SUT MNCRL AB 4-0 PS2 18 (SUTURE) ×6 IMPLANT
SUT PROLENE 5 0 C 1 24 (SUTURE) IMPLANT
SUT SILK 2 0 SH (SUTURE) IMPLANT
SUT VIC AB 2-0 CT1 27 (SUTURE)
SUT VIC AB 2-0 CT1 TAPERPNT 27 (SUTURE) IMPLANT
SUT VIC AB 3-0 SH 27 (SUTURE)
SUT VIC AB 3-0 SH 27X BRD (SUTURE) IMPLANT
SYR 10ML LL (SYRINGE) ×9 IMPLANT
SYR 20CC LL (SYRINGE) ×3 IMPLANT
SYR 30ML LL (SYRINGE) ×3 IMPLANT
SYR MEDRAD MARK V 150ML (SYRINGE) IMPLANT
TOWEL GREEN STERILE (TOWEL DISPOSABLE) ×3 IMPLANT
TRAY FOLEY W/METER SILVER 16FR (SET/KITS/TRAYS/PACK) ×3 IMPLANT
TUBING HIGH PRESSURE 120CM (CONNECTOR) ×3 IMPLANT
UNDERPAD 30X30 (UNDERPADS AND DIAPERS) IMPLANT
WATER STERILE IRR 1000ML POUR (IV SOLUTION) IMPLANT
WIRE AMPLATZ SS-J .035X180CM (WIRE) ×6 IMPLANT
WIRE BENTSON .035X145CM (WIRE) ×6 IMPLANT
WIRE G V18X300CM (WIRE) ×3 IMPLANT

## 2017-05-11 NOTE — Anesthesia Preprocedure Evaluation (Addendum)
Anesthesia Evaluation  Patient identified by MRN, date of birth, ID band Patient awake    Reviewed: Allergy & Precautions, NPO status , Patient's Chart, lab work & pertinent test results  Airway Mallampati: II  TM Distance: >3 FB Neck ROM: Full    Dental  (+) Teeth Intact, Dental Advisory Given   Pulmonary Current Smoker,    breath sounds clear to auscultation       Cardiovascular hypertension, + Peripheral Vascular Disease   Rhythm:Regular Rate:Normal     Neuro/Psych CVA    GI/Hepatic negative GI ROS, Neg liver ROS,   Endo/Other  negative endocrine ROS  Renal/GU Renal InsufficiencyRenal disease     Musculoskeletal   Abdominal   Peds  Hematology negative hematology ROS (+)   Anesthesia Other Findings   Reproductive/Obstetrics                           Anesthesia Physical Anesthesia Plan  ASA: IV  Anesthesia Plan: General   Post-op Pain Management:    Induction: Intravenous  PONV Risk Score and Plan: 3 and Ondansetron, Dexamethasone, Midazolam and Propofol infusion  Airway Management Planned:   Additional Equipment: Arterial line and CVP  Intra-op Plan:   Post-operative Plan: Extubation in OR and Possible Post-op intubation/ventilation  Informed Consent: I have reviewed the patients History and Physical, chart, labs and discussed the procedure including the risks, benefits and alternatives for the proposed anesthesia with the patient or authorized representative who has indicated his/her understanding and acceptance.     Plan Discussed with:   Anesthesia Plan Comments: (Likely this case is vastly underposted and will require hours longer .)        Anesthesia Quick Evaluation

## 2017-05-11 NOTE — Anesthesia Procedure Notes (Signed)
Procedure Name: Intubation Date/Time: 05/11/2017 7:37 AM Performed by: Izora Gala Pre-anesthesia Checklist: Patient identified, Emergency Drugs available, Suction available and Patient being monitored Patient Re-evaluated:Patient Re-evaluated prior to induction Oxygen Delivery Method: Circle system utilized Preoxygenation: Pre-oxygenation with 100% oxygen Induction Type: IV induction Ventilation: Mask ventilation without difficulty Laryngoscope Size: Miller and 3 Grade View: Grade I Tube type: Oral Tube size: 8.0 mm Number of attempts: 1 Airway Equipment and Method: Stylet Placement Confirmation: ETT inserted through vocal cords under direct vision,  positive ETCO2 and breath sounds checked- equal and bilateral Secured at: 22 cm Tube secured with: Tape Dental Injury: Teeth and Oropharynx as per pre-operative assessment

## 2017-05-11 NOTE — Anesthesia Procedure Notes (Addendum)
Central Venous Catheter Insertion Performed by: Rica Koyanagi, anesthesiologist Start/End9/27/2018 7:00 AM, 05/11/2017 7:15 AM Patient location: OOR procedure area. Preanesthetic checklist: patient identified, IV checked, site marked, risks and benefits discussed, surgical consent, monitors and equipment checked, pre-op evaluation and timeout performed Position: Trendelenburg Lidocaine 1% used for infiltration and patient sedated Hand hygiene performed , maximum sterile barriers used  and Seldinger technique used Catheter size: 9 Fr Central line was placed.MAC introducer Procedure performed using ultrasound guided technique. Ultrasound Notes:anatomy identified, needle tip was noted to be adjacent to the nerve/plexus identified, no ultrasound evidence of intravascular and/or intraneural injection and image(s) printed for medical record Attempts: 1 Following insertion, line sutured, dressing applied and Biopatch. Post procedure assessment: blood return through all ports, no air and free fluid flow  Patient tolerated the procedure well with no immediate complications.

## 2017-05-11 NOTE — Op Note (Signed)
Patient name: Larry Aguilar MRN: [id number] DOB: [birth year] Sex: male  05/11/2017 Pre-operative Diagnosis: AAA, Right sfa and popliteal artery aneurysms Post-operative diagnosis:  Same Surgeon:  Eda Paschal. Donzetta Matters, MD Co-Surgeon: Annamarie Major, MD Procedure Performed: 1.  US guided cannulation of bilateral common femoral arteries 2.  Stent of right SFA and popliteal arteries with 8 x 274mm distal and proximal 8 x 131mm viabahn stents 3.  Endovascular aortic aneurysm repair with right sided main body 31 x 14 x 150 Gore excluder, contralateral limb 18 x 12mm and proximal cuff 32 x 52mm 4.  Placement of Aptus endoanchors x 6 proximal aneurysmal neck 5.  Percutaneous closure of bilateral common femoral artery cannulation sites with proglide device   Indications:  83 year old male with a history of an abdominal aortic aneurysm recently found to have right-sided SFA and popliteal artery aneurysms and acute expansion of his aortic aneurysm indicated for repair of both.  Findings: There is evidence of large right-sided SFA and popliteal artery aneurysms that are excluded following stenting. There is an apparent type II endoleak from a side branch distally in the SFA on the right side and runoff via the posterior tibial artery on the right. After aneurysm repair initially our graft was approximately 1 cm distal to the lowest renal and a cuff was placed. A completion there were no type I endoleak's but appearnce of a type II endoleak from the IMA. There were strong signals in the bilateral posterior tibial arteries at completion.   Procedure:  The patient was identified in the holding area and taken to the operating room where his placed supine on the operating table and general endotracheal anesthesia was induced. He was given antibiotics sterilely prepped and draped in his chest abdomen and bilateral thighs in the usual fashion. Timeout was called. We used ultrasound to cannulate the left common  femoral artery with micropuncture needle followed by a wire and sheath. We then placed a Bentson wire and dilated with 8 French sheath and deployed to provide devices in the 10 and 2 positions. We then exchanged for a longer 8 [place] sheath. Using a bare catheter and ultimately Glidewire were able to cross the bifurcation and then performed right lower extremity angiogram with the above findings of aneurysms in both the SFA and popliteal arteries with runoff via the posterior tibial artery. We then placed a stiff wire into the common femoral artery on the right and exchanged for a large 7 French sheath into the SFA and 7000 units of heparin was administered. Using a V 18 wire and quick cross catheter we over to cross through all the stenoses and aneurysmal segments of the SFA down the below-knee popliteal artery and confirmed intraluminal access. We then deployed first a 8 x 250 mm viabahn stent distally and then performed angiogram in the groin and deployed an extension to 150 mm proximally. We then ballooned this entire stent with a 7 mm balloon. Completion angiogram demonstrated brisk flow through the stents with no residual stenosis no aneurysms although there was a type II endoleak and the popliteal aneurysm distally, likely side branch.  The 7 French sheath and wire were then retracted into the left external iliac artery. We his ultrasound to cannulate the right common femoral artery with micropuncture needle and placed a wire and sheath exchanged for Bentson wire and then deployed to provide devices in the right common femoral artery. We then placed a short 8 French sheath and used a bare catheter and  Bentson wire to get into the aorta and exchanged for a stiff sheath. An additional 5000 units of heparin was given. We then placed a long 34 French sheath into the aorta under fluoroscopic guidance and then brought our right sided main body device into the aorta. A pigtail was prepped the left side over a  stiff wire and we also also exchanged for a 12 French sheath on the left. We then performed aortogram identified our renal arteries and deployed our main body device. Fortunately it did fall we re-conformed and moved it up and performed a repeat angiogram which demonstrated that it was right below our renal artery on the left. Gait was open and then was cannulated from the left side with a bare catheter and Glidewire and we exchanged for stiff wire and then placed our Omni catheter were able to suspend this easily. Retrograde angiogram demonstrated a hypogastric artery which we measured out. We then exchanged over stiff wire for our contralateral limb which was then deployed to just above the hypogastric artery. We then retracted our 18 French sheath on the right and finished our deployment of our main body. We then ballooned the entirety of our graft from both sides with Q 50 balloon.  We then made the decision to Prophylactically place Endo anchors. We were able to place 6 in different orientations at the neck of the aneurysm. In AP view we then performed repeat angiogram which demonstrated that are graft had slid down to about 1 cm below the lowest renal artery on the left. There were no type I endoleak's although there was a type II from the IMA as expected. We then placed a 32 mm cuff up to the level of the left renal artery and ballooned this. Completion angiogram demonstrated that we were closer and satisfied with this we removed all our catheters and replaced with Bentson wires. We first percutaneous the close the left side after removing the 12 French sheath. We then subsequently removed our 39 French sheath on the right and percutaneously close this. We checked signals in our bilateral feet which were strong posterior tibials and 25 mg of protamine was given. The groin incisions were then closed with 4-0 Monocryl suture. Patient did tolerate this procedure well without immediate complication. All counts  were correct at completion.   Contrast 137 mL.   Bhavik Cabiness C. Donzetta Matters, MD [facility] Office: [phone] Pager: [phone]

## 2017-05-11 NOTE — Anesthesia Procedure Notes (Signed)
Arterial Line Insertion Start/End9/27/2018 6:50 AM Performed by: Izora Gala  Patient location: Pre-op. Lidocaine 1% used for infiltration Right, radial was placed Catheter size: 20 G Hand hygiene performed  and maximum sterile barriers used   Attempts: 2 Procedure performed without using ultrasound guided technique. Following insertion, dressing applied and Biopatch. Post procedure assessment: unchanged  Patient tolerated the procedure well with no immediate complications.

## 2017-05-11 NOTE — H&P (Signed)
   History and Physical Update  The patient was interviewed and re-examined.  The patient's previous History and Physical has been reviewed and is unchanged from recent office visit. Will plan for right sfa/popliteal artery stenting and EVAR with endoanchors.   Leoncio Hansen C. Donzetta Matters, MD [facility] Office: [phone] Pager: [phone]  05/11/2017, 7:11 AM

## 2017-05-11 NOTE — Progress Notes (Signed)
  Day of Surgery Note    Subjective:  No complaints   Vitals:   05/11/17 1349 05/11/17 1407  BP: (!) 164/90 (!) 146/80  Pulse: 60   Resp: 20   Temp:    SpO2: 100%     Incisions:   Bilateral groins are soft without hematoma Extremities:  brisk doppler signals bilateral PT and right DP Cardiac:  regular Lungs:  Non labored Abdomen:  Soft, NT/ND   Assessment/Plan:  This is a 83 y.o. male who is s/p  1.  US guided cannulation of bilateral common femoral arteries 2.  Stent of right SFA and popliteal arteries with 8 x 212mm distal and proximal 8 x 13mm viabahn stents 3.  Endovascular aortic aneurysm repair with right sided main body 31 x 14 x 150 Gore excluder, contralateral limb 18 x 164mm and proximal cuff 32 x 12mm 4.  Placement of Aptus endoanchors x 6 proximal aneurysmal neck 5.  Percutaneous closure of bilateral common femoral artery cannulation sites with proglide device  -pt doing well in pacu sitting up eating lunch -brisk doppler signals bilateral PT and right DP -to 4 east when bed available   Leontine Locket, PA-C 05/11/2017 2:31 PM [phone]

## 2017-05-11 NOTE — Transfer of Care (Signed)
Immediate Anesthesia Transfer of Care Note  Patient: Larry Aguilar  Procedure(s) Performed: Procedure(s): ABDOMINAL AORTIC ENDOVASCULAR STENT GRAFT- WITH ENDOANCHOR (N/A) AORTOGRAM with RIGHT LOWER EXTREMITY STENT (N/A)  Patient Location: PACU  Anesthesia Type:General  Level of Consciousness: awake, alert  and patient cooperative  Airway & Oxygen Therapy: Patient Spontanous Breathing and Patient connected to nasal cannula oxygen  Post-op Assessment: Report given to RN, Post -op Vital signs reviewed and stable and Patient moving all extremities  Post vital signs: Reviewed and stable  Last Vitals:  Vitals:   05/11/17 0613  BP: (!) 166/91  Pulse: 62  Resp: 18  Temp: 36.6 C  SpO2: 100%    Last Pain:  Vitals:   05/11/17 0613  TempSrc: Oral      Patients Stated Pain Goal: 2 (29/52/84 1324)  Complications: No apparent anesthesia complications

## 2017-05-11 NOTE — Discharge Instructions (Signed)
° °[facility]  ° °Discharge Instructions ° °Endovascular Aortic Aneurysm Repair ° °Please refer to the following instructions for your post-procedure care. Your surgeon or Physician Assistant will discuss any changes with you. ° °Activity ° °You are encouraged to walk as much as you can. You can slowly return to normal activities but must avoid strenuous activity and heavy lifting until your doctor tells you it's OK. Avoid activities such as vacuuming or swinging a gold club. It is normal to feel tired for several weeks after your surgery. Do not drive until your doctor gives the OK and you are no longer taking prescription pain medications. It is also normal to have difficulty with sleep habits, eating, and bowel movements after surgery. These will go away with time. ° °Bathing/Showering ° °You may shower after you go home. If you have an incision, do not soak in a bathtub, hot tub, or swim until the incision heals completely. ° °If you have incisions in your groin, sash the groin wounds with soap and water daily and pat dry. (No tub bath-only shower)  Then put a dry gauze or washcloth there to keep this area dry to help prevent wound infection daily and as needed.  Do not use Vaseline or neosporin on your incisions.  Only use soap and water on your incisions and then protect and keep dry. ° °Incision Care ° °Shower every day. Clean your incision with mild soap and water. Pat the area dry with a clean towel. You do not need a bandage unless otherwise instructed. Do not apply any ointments or creams to your incision. If you clothing is irritating, you may cover your incision with a dry gauze pad. ° °Diet ° °Resume your normal diet. There are no special food restrictions following this procedure. A low fat/low cholesterol diet is recommended for all patients with vascular disease. In order to heal from your surgery, it is CRITICAL to get adequate nutrition. Your body requires  vitamins, minerals, and protein. Vegetables are the best source of vitamins and minerals. Vegetables also provide the perfect balance of protein. Processed food has little nutritional value, so try to avoid this. ° °Medications ° °Resume taking all of your medications unless your doctor or nurse practitioner tells you not to. If your incision is causing pain, you may take over-the-counter pain relievers such as acetaminophen (Tylenol). If you were prescribed a stronger pain medication, please be aware these medications can cause nausea and constipation. Prevent nausea by taking the medication with a snack or meal. Avoid constipation by drinking plenty of fluids and eating foods with a high amount of fiber, such as fruits, vegetables, and grains. Do not take Tylenol if you are taking prescription pain medications. ° ° °Follow up ° °Our office will schedule a follow-up appointment with a C.T. scan 3-4 weeks after your surgery. ° °Please call us immediately for any of the following conditions ° °Severe or worsening pain in your legs or feet or in your abdomen back or chest. °Increased pain, redness, drainage (pus) from your incision sit. °Increased abdominal pain, bloating, nausea, vomiting or persistent diarrhea. °Fever of 101 degrees or higher. °Swelling in your leg (s), ° °Reduce your risk of vascular disease ° °•Stop smoking. If you would like help call [facility] at 1-800-QUIT-NOW (1-800-784-8669) or [facility] at 336-586-4000. °•Manage your cholesterol °•Maintain a desired weight °•Control your diabetes °•Keep your blood pressure down ° °If you have questions, please call the office at [phone]. ° °

## 2017-05-11 NOTE — Anesthesia Postprocedure Evaluation (Signed)
Anesthesia Post Note  Patient: Larry Aguilar  Procedure(s) Performed: Procedure(s) (LRB): ABDOMINAL AORTIC ENDOVASCULAR STENT GRAFT- WITH ENDOANCHOR (N/A) AORTOGRAM with RIGHT LOWER EXTREMITY STENT (N/A)     Patient location during evaluation: PACU Anesthesia Type: General Level of consciousness: awake and alert Pain management: pain level controlled Vital Signs Assessment: post-procedure vital signs reviewed and stable Respiratory status: spontaneous breathing, nonlabored ventilation, respiratory function stable and patient connected to nasal cannula oxygen Cardiovascular status: blood pressure returned to baseline and stable Postop Assessment: no apparent nausea or vomiting Anesthetic complications: no    Last Vitals:  Vitals:   05/11/17 1250 05/11/17 1304  BP: (!) 144/82 (!) 145/81  Pulse: (!) 59 (!) 55  Resp: 18 17  Temp:    SpO2: 98% 98%    Last Pain:  Vitals:   05/11/17 1300  TempSrc:   PainSc: 0-No pain                 Anjelique Makar,Matis TERRILL

## 2017-05-12 ENCOUNTER — Encounter (HOSPITAL_COMMUNITY): Payer: Self-pay | Admitting: Vascular Surgery

## 2017-05-12 ENCOUNTER — Ambulatory Visit: Payer: Medicare Other | Admitting: Family

## 2017-05-12 ENCOUNTER — Other Ambulatory Visit (HOSPITAL_COMMUNITY): Payer: Medicare Other

## 2017-05-12 ENCOUNTER — Encounter (HOSPITAL_COMMUNITY): Payer: Medicare Other

## 2017-05-12 LAB — BASIC METABOLIC PANEL
ANION GAP: 7 (ref 5–15)
BUN: 30 mg/dL — ABNORMAL HIGH (ref 6–20)
CALCIUM: 8.3 mg/dL — AB (ref 8.9–10.3)
CO2: 20 mmol/L — ABNORMAL LOW (ref 22–32)
Chloride: 112 mmol/L — ABNORMAL HIGH (ref 101–111)
Creatinine, Ser: 1.82 mg/dL — ABNORMAL HIGH (ref 0.61–1.24)
GFR calc non Af Amer: 33 mL/min — ABNORMAL LOW (ref >60)
GFR, EST AFRICAN AMERICAN: 38 mL/min — AB (ref >60)
Glucose, Bld: 120 mg/dL — ABNORMAL HIGH (ref 65–99)
Potassium: 4.4 mmol/L (ref 3.5–5.1)
SODIUM: 139 mmol/L (ref 135–145)

## 2017-05-12 LAB — CBC
HEMATOCRIT: 33.8 % — AB (ref 39.0–52.0)
Hemoglobin: 11.3 g/dL — ABNORMAL LOW (ref 13.0–17.0)
MCH: 31.9 pg (ref 26.0–34.0)
MCHC: 33.4 g/dL (ref 30.0–36.0)
MCV: 95.5 fL (ref 78.0–100.0)
PLATELETS: 103 10*3/uL — AB (ref 150–400)
RBC: 3.54 MIL/uL — ABNORMAL LOW (ref 4.22–5.81)
RDW: 12.9 % (ref 11.5–15.5)
WBC: 11.1 10*3/uL — AB (ref 4.0–10.5)

## 2017-05-12 NOTE — Progress Notes (Signed)
Pt/family given discharge instructions, medication lists, follow up appointments, and when to call the doctor.  Pt/family verbalizes understanding. Pt given signs and symptoms of infection. Manolito Jurewicz McClintock, RN    

## 2017-05-12 NOTE — Progress Notes (Addendum)
[facility]  Subjective  - doing well with out new complaints    Objective (!) 145/90 70 97.8 F (36.6 C) (Oral) 19 99%  Intake/Output Summary (Last 24 hours) at 05/12/17 0730 Last data filed at 05/12/17 0441  Gross per 24 hour  Intake             1650 ml  Output             2295 ml  Net             -645 ml    Palpable PT B Groins soft without hematoma Heart RRR Lungs non labored breathing  Assessment/Planning: POD #  1 This is a 83 y.o. male who is s/p  1. US guided cannulation of bilateral common femoral arteries 2. Stent of right SFA and popliteal arteries with 8 x 263mm distal and proximal 8 x 181mm viabahn stents 3. Endovascular aortic aneurysm repair with right sided main body 31 x 14 x 150 Gore excluder, contralateral limb 18 x 151mm and proximal cuff 32 x 61mm 4. Placement of Aptus endoanchors x 6 proximal aneurysmal neck 5. Percutaneous closure of bilateral common femoral artery cannulation sites with proglide device  Will d/c A line Ambulate eat breakfast and void pior to discharge F/U in 4 weeks with Dr. Marquis Buggy, EMMA [facility] 05/12/2017 7:30 AM --  Laboratory Lab Results:  Recent Labs  05/11/17 1953 05/12/17 0359  WBC 8.2 11.1*  HGB 11.9* 11.3*  HCT 35.3* 33.8*  PLT 127* 103*   BMET  Recent Labs  05/11/17 1152 05/11/17 1953 05/12/17 0359  NA 137  --  139  K 4.4  --  4.4  CL 110  --  112*  CO2 19*  --  20*  GLUCOSE 116*  --  120*  BUN 27*  --  30*  CREATININE 1.63* 1.92* 1.82*  CALCIUM 8.2*  --  8.3*    COAG Lab Results  Component Value Date   INR 1.13 05/11/2017   INR 1.02 05/10/2017   INR 1.0 04/07/2007   No results found for: PTT  I have independently interviewed and examined patient and agree with PA assessment and plan above. F/u in 1 month with EVAR duplex and RLE duplex.   Brandon C. Donzetta Matters, MD [facility] [place] Office: [phone] Pager:  [phone]

## 2017-05-12 NOTE — Care Management Note (Signed)
Case Management Note Marvetta Gibbons RN, BSN Unit 4E-Case Manager [phone]  Patient Details  Name: Larry Aguilar MRN: [id number] Date of Birth: Apr 04, 1934  Subjective/Objective:   Pt admitted s/p Endovascular aortic aneurysm repair                 Action/Plan: PTA pt lived at home with family- plan to d/c home with family- no CM needs noted for discharge.   Expected Discharge Date:  05/12/17               Expected Discharge Plan:  Home/Self Care  In-House Referral:     Discharge planning Services  CM Consult  Post Acute Care Choice:  NA Choice offered to:  NA  DME Arranged:    DME Agency:     HH Arranged:    HH Agency:     Status of Service:  Completed, signed off  If discussed at [place] of Stay Meetings, dates discussed:    Discharge Disposition: home/self care  Additional Comments:  Dawayne Patricia, RN 05/12/2017, 10:24 AM

## 2017-05-12 NOTE — Op Note (Signed)
    Patient name: Larry Aguilar MRN: [id number] DOB: Jan 10, 1934 Sex: male  05/11/2017 Pre-operative Diagnosis: AAA Post-operative diagnosis:  Same Surgeon:  Annamarie Major CO-surgeon:  Servando Snare Procedure:   1.  US guided cannulation of bilateral common femoral arteries 2.  Stent of right SFA and popliteal arteries with 8 x 268mm distal and proximal 8 x 167mm viabahn stents 3.  Endovascular aortic aneurysm repair with right sided main body 31 x 14 x 150 Gore excluder, contralateral limb 18 x 144mm and proximal cuff 32 x 42mm 4.  Placement of Aptus endoanchors x 6 proximal aneurysmal neck 5.  Percutaneous closure of bilateral common femoral artery cannulation sites with proglide device Anesthesia:  General Blood Loss:  See anesthesia record Specimens:  None  Findings:  Complete exclusion, Type II endoleak from IMA  Indications:  83 year old male with a history of an abdominal aortic aneurysm recently found to have right-sided SFA and popliteal artery aneurysms and acute expansion of his aortic aneurysm indicated for repair of both.  Procedure:  The patient was identified in the holding area and taken to [place] [facility]  The patient was then placed supine on the table. general anesthesia was administered.  The patient was prepped and draped in the usual sterile fashion.  A time out was called and antibiotics were administered.  Two surgeons were required for the procedure due to the complexity of the case.  I was present for the entire case.  Please see Dr. Claretha Cooper operative note for the full details of the procedure.   Disposition:  To PACU stable   V. Annamarie Major, M.D. [facility] [place] Office: [phone] Pager:  [phone]

## 2017-05-15 LAB — BPAM RBC
BLOOD PRODUCT EXPIRATION DATE: 201810182359
Blood Product Expiration Date: 201810182359
ISSUE DATE / TIME: 201809270724
ISSUE DATE / TIME: 201809270724
UNIT TYPE AND RH: 5100
Unit Type and Rh: 5100

## 2017-05-15 LAB — TYPE AND SCREEN
ABO/RH(D): O POS
Antibody Screen: NEGATIVE
UNIT DIVISION: 0
Unit division: 0

## 2017-05-16 NOTE — Discharge Summary (Signed)
[facility] Discharge Summary   Patient ID:  Larry Aguilar MRN: [id number] DOB/AGE: 81-Oct-1935 81 y.o.  Admit date: 05/11/2017 Discharge date: 05/12/2017 Date of Surgery: 05/11/2017 Surgeon: Surgeon(s): Waynetta Sandy, MD Serafina Mitchell, MD  Admission Diagnosis: Abdominal Aortic Aneurysm  I71.4 Right Superficial Femoral Artery Aneurysm  I72.4  Discharge Diagnoses:  Abdominal Aortic Aneurysm  I71.4 Right Superficial Femoral Artery Aneurysm  I72.4  Secondary Diagnoses: Past Medical History:  Diagnosis Date  . AAA (abdominal aortic aneurysm) ([place])   . CKD (chronic kidney disease), stage III   . Hypertension   . Kidney disease    Left kidney mass,  polycystic kidney  . Stroke ([place])   . Syncope     Procedure(s): ABDOMINAL AORTIC ENDOVASCULAR STENT GRAFT- WITH ENDOANCHOR AORTOGRAM with RIGHT LOWER EXTREMITY STENT  Discharged Condition: good  DX: Aneurysm (5.7 cm AAA)   HPI: Larry Aguilar is a 83 y.o. male with known abdominal aortic aneurysm recently presented to [facility] with right leg pain and bulge. There he was found to have enlarged abdominal aortic aneurysm as well as an SFA aneurysm. 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 He has known polycystic kidney disease as well as a history of a stroke but does not have any coronary artery disease that he knows of. His last stress test was several years ago. He is a current every day smoker and has been such that she was very young. He does have hypertension controlled on 2 medications does not take any blood thinners. He has not had any new back or abdominal pain. His limitation to walking is hip pain he has no claudication symptoms. He does not have rest pain or tissue loss in his feet.   Hospital Course:  Larry Aguilar is a 83 y.o. male is S/P  Procedure(s): ABDOMINAL AORTIC ENDOVASCULAR STENT GRAFT- WITH ENDOANCHOR AORTOGRAM with RIGHT LOWER EXTREMITY STENT   Palpable PT  B Groins soft without hematoma Heart RRR Lungs non labored breathing  Assessment/Planning: POD #  1 This is a 83 y.o.malewho is s/p  1. US guided cannulation of bilateral common femoral arteries 2. Stent of right SFA and popliteal arteries with 8 x 281mm distal and proximal 8 x 180mm viabahn stents 3. Endovascular aortic aneurysm repair with right sided main body 31 x 14 x 150 Gore excluder, contralateral limb 18 x 154mm and proximal cuff 32 x 17mm 4. Placement of Aptus endoanchors x 6 proximal aneurysmal neck 5. Percutaneous closure of bilateral common femoral artery cannulation sites with proglide device  Will d/c A line Ambulate eat breakfast and void pior to discharge F/U in 4 weeks with Dr. Donzetta Matters    Significant Diagnostic Studies: CBC Lab Results  Component Value Date   WBC 11.1 (H) 05/12/2017   HGB 11.3 (L) 05/12/2017   HCT 33.8 (L) 05/12/2017   MCV 95.5 05/12/2017   PLT 103 (L) 05/12/2017    BMET    Component Value Date/Time   NA 139 05/12/2017 0359   K 4.4 05/12/2017 0359   CL 112 (H) 05/12/2017 0359   CO2 20 (L) 05/12/2017 0359   GLUCOSE 120 (H) 05/12/2017 0359   BUN 30 (H) 05/12/2017 0359   CREATININE 1.82 (H) 05/12/2017 0359   CREATININE 1.63 (H) 05/22/2012 0809   CALCIUM 8.3 (L) 05/12/2017 0359   GFRNONAA 33 (L) 05/12/2017 0359   GFRAA 38 (L) 05/12/2017 0359   COAG Lab Results  Component Value Date   INR 1.13 05/11/2017  INR 1.02 05/10/2017   INR 1.0 04/07/2007     Disposition:  Discharge to :Home Discharge Instructions    Call MD for:  redness, tenderness, or signs of infection (pain, swelling, bleeding, redness, odor or green/yellow discharge around incision site)    Complete by:  As directed    Call MD for:  severe or increased pain, loss or decreased feeling  in affected limb(s)    Complete by:  As directed    Call MD for:  temperature >100.5    Complete by:  As directed    Discharge instructions    Complete by:  As directed     Remove dressing in 24 hours and shower as needed.  No heavy lifting for 2-3 weeks.   Discontinue central line    Complete by:  As directed    Driving Restrictions    Complete by:  As directed    No driving for 1 week   Resume previous diet    Complete by:  As directed      Allergies as of 05/12/2017      Reactions   Penicillins       Medication List    TAKE these medications   amLODipine 2.5 MG tablet Commonly known as:  NORVASC Take 2.5 mg by mouth daily.   aspirin 81 MG tablet Take 81 mg by mouth daily.   lisinopril 20 MG tablet Commonly known as:  PRINIVIL,ZESTRIL Take 20 mg by mouth daily.   meclizine 25 MG tablet Commonly known as:  ANTIVERT Take 25 mg by mouth 3 (three) times daily as needed for dizziness.   oxyCODONE-acetaminophen 5-325 MG tablet Commonly known as:  ROXICET Take 1 tablet by mouth every 6 (six) hours as needed for severe pain.      Verbal and written Discharge instructions given to the patient. Wound care per Discharge AVS Follow-up Information    Waynetta Sandy, MD Follow up in 4 week(s).   Specialties:  Vascular Surgery, Cardiology Why:  Office will call you to arrange your appt (sent) Contact information: [place] [address] [phone]           Signed: [place], [place] use --- Instructions: Press F2 to tab through selections.  Delete question if not applicable.   Post-op:  Time to Extubation: [x ] In OR, [ ]  < 12 hrs, [ ]  12-24 hrs, [ ]  >=24 hrs Vasopressors Req. Post-op: No MI: [x ] No, [ ]  Troponin only, [ ]  EKG or Clinical New Arrhythmia: No CHF: No ICU Stay: 0 days Transfusion: No  If yes,  units given  Complications: Resp failure: [x ] none, [ ]  Pneumonia, [ ]  Ventilator Chg in renal function: [x ] none, [ ]  Inc. Cr > 0.5, [ ]  Temp. Dialysis, [ ]  Permanent dialysis Leg ischemia: [x ] No, [ ]  Yes, no Surgery needed, [ ]  Yes, Surgery needed, [ ]  Amputation Bowel  ischemia: [ x] No, [ ]  Medical Rx, [ ]  Surgical Rx Wound complication: [x ] No, [ ]  Superficial separation/infection, [ ]  Return to OR Return to OR: No  Return to OR for bleeding: No Stroke: [x ] None, [ ]  Minor, [ ]  Major  Discharge medications: Statin use:  No  for medical reason   ASA use:  Yes Plavix use:  No  for medical reason   Beta blocker use:  No  for medical reason    05/16/2017, 11:25 AM

## 2017-05-17 ENCOUNTER — Telehealth: Payer: Self-pay | Admitting: Vascular Surgery

## 2017-05-17 NOTE — Telephone Encounter (Signed)
Sched ABI 10/31/*18 at 3:00. Sched CTA 06/16/17 at 10:30 at [place]. Sched MD 06/16/17 at 12:00. Spoke to daughter.

## 2017-05-17 NOTE — Telephone Encounter (Signed)
-----   Message from Denman George, RN sent at 05/11/2017  5:19 PM EDT ----- Regarding: needs 4 week f/u with CTA and ABI's and office appt. with Dr. Donzetta Matters   ----- Message ----- From: Natividad Brood Sent: 05/11/2017   2:32 PM To: Vvs Charge Pool  S/p EVAR  F/u with CTA and ABI's in 4 weeks.  Thanks

## 2017-06-13 ENCOUNTER — Encounter (HOSPITAL_COMMUNITY): Payer: Medicare Other

## 2017-06-14 ENCOUNTER — Ambulatory Visit (HOSPITAL_COMMUNITY)
Admission: RE | Admit: 2017-06-14 | Discharge: 2017-06-14 | Disposition: A | Discharge status: Home / Self Care | Payer: Medicare Other | Source: Ambulatory Visit | Attending: Vascular Surgery | Admitting: Vascular Surgery

## 2017-06-14 ENCOUNTER — Encounter (HOSPITAL_COMMUNITY): Payer: Medicare Other

## 2017-06-14 DIAGNOSIS — I739 Peripheral vascular disease, unspecified: Secondary | ICD-10-CM | POA: Diagnosis not present

## 2017-06-14 DIAGNOSIS — I714 Abdominal aortic aneurysm, without rupture, unspecified: Secondary | ICD-10-CM

## 2017-06-16 ENCOUNTER — Ambulatory Visit
Admission: RE | Admit: 2017-06-16 | Discharge: 2017-06-16 | Disposition: A | Discharge status: Home / Self Care | Payer: Medicare Other | Source: Ambulatory Visit | Attending: Vascular Surgery | Admitting: Vascular Surgery

## 2017-06-16 ENCOUNTER — Ambulatory Visit (INDEPENDENT_AMBULATORY_CARE_PROVIDER_SITE_OTHER): Payer: Self-pay | Admitting: Vascular Surgery

## 2017-06-16 ENCOUNTER — Other Ambulatory Visit: Payer: Self-pay | Admitting: Vascular Surgery

## 2017-06-16 ENCOUNTER — Encounter: Payer: Self-pay | Admitting: Vascular Surgery

## 2017-06-16 VITALS — BP 174/100 | HR 74 | Temp 97.0°F | Resp 20 | Ht 72.0 in | Wt 152.0 lb

## 2017-06-16 DIAGNOSIS — I714 Abdominal aortic aneurysm, without rupture, unspecified: Secondary | ICD-10-CM

## 2017-06-16 DIAGNOSIS — I724 Aneurysm of artery of lower extremity: Secondary | ICD-10-CM

## 2017-06-16 DIAGNOSIS — Z8679 Personal history of other diseases of the circulatory system: Secondary | ICD-10-CM

## 2017-06-16 DIAGNOSIS — Z95828 Presence of other vascular implants and grafts: Secondary | ICD-10-CM

## 2017-06-16 DIAGNOSIS — Z48812 Encounter for surgical aftercare following surgery on the circulatory system: Secondary | ICD-10-CM

## 2017-06-16 NOTE — Addendum Note (Signed)
Addended by: Lianne Cure A on: 06/16/2017 04:49 PM   Modules accepted: Orders

## 2017-06-16 NOTE — Progress Notes (Signed)
Subjective:     Patient ID: Larry Aguilar, male   DOB: 11/14/1933, 83 y.o.   MRN: [id number]  HPI 83 year old male follows up status post stent of right SFA as well as endovascular aneurysm repair.  He still has bilateral hip pain.  He had a CT scan without contrast performed prior to this visit as well as ABIs.  He is back to work and taking aspirin.  He does take oxygen at home his shortness of breath with very minimal activity.  He has demonstrated some leg swelling but is otherwise at his usual level of activity.  He continues to smoke daily.   Review of Systems Bilateral hip pain O2 at home    Objective:   Physical Exam aaox3 Abdomen is soft, no palpable masses Palpable bilateral pt's Femoral incisions are in tact  CT non contrast 5.5 mm subpleural nodule seen in right lower lobe. Non-contrast<BR>chest CT at 6-12 months is recommended. If the nodule is stable at<BR>time of repeat CT, then future CT at 18-24 months (from today's<BR>scan) is considered optional for low-risk patients, but is<BR>recommended for high-risk patients. This recommendation follows the<BR>consensus statement: Guidelines for Management of Incidental<BR>Pulmonary Nodules Detected on CT Images: From the Fleischner Society<BR>2017; Radiology 2017; 284:228-243.<BR><BR>Stable diffuse cystic change involving both kidneys with diffuse<BR>enlargement consistent with polycystic kidney disease.<BR><BR>Multiple hepatic cysts are again noted and stable.<BR><BR>Sigmoid diverticulosis without inflammation.<BR><BR>Status post endovascular stent graft repair of infrarenal abdominal<BR>aortic aneurysm. Stent graft appears to be in grossly good position,<BR>although endoleak cannot be excluded on the basis of this exam due<BR>to lack of intravenous contrast. Aneurysm has maximum measured<BR>diameter 5.6 cm which is not significantly changed compared to prior  ABI: 0.99/0.99    Assessment/plan     83 year old male follows up status  post endovascular aneurysm repair.  CT scan is noncontrast but does not appear to have any larger aneurysm that is nonpalpable on exam he has palpable femoral pulses.  He also has palpable posterior tibial pulses bilaterally and his previously palpable SFA is nonpalpable status post stenting.  He remains on aspirin.  He will follow-up in 3 months with evar and right lower extremity duplex.  He demonstrates good understanding we will see him in 3 months.   Virgil Slinger C. Donzetta Matters, MD [facility] [place] Office: [phone] Pager: [phone]

## 2017-09-15 ENCOUNTER — Ambulatory Visit: Payer: Medicare Other | Admitting: Vascular Surgery

## 2017-09-15 ENCOUNTER — Other Ambulatory Visit (HOSPITAL_COMMUNITY): Payer: Medicare Other

## 2017-09-15 ENCOUNTER — Encounter (HOSPITAL_COMMUNITY): Payer: Medicare Other

## 2017-09-27 ENCOUNTER — Encounter (HOSPITAL_COMMUNITY): Payer: Medicare Other

## 2017-09-27 ENCOUNTER — Other Ambulatory Visit (HOSPITAL_COMMUNITY): Payer: Medicare Other

## 2017-09-27 ENCOUNTER — Ambulatory Visit: Payer: Medicare Other | Admitting: Vascular Surgery

## 2018-11-16 ENCOUNTER — Inpatient Hospital Stay (HOSPITAL_COMMUNITY)
Admission: AD | Admit: 2018-11-16 | Discharge: 2018-11-18 | DRG: 481 | Disposition: A | Discharge status: Home Health | Payer: Medicare Other | Source: Other Acute Inpatient Hospital | Attending: Internal Medicine | Admitting: Internal Medicine

## 2018-11-16 ENCOUNTER — Inpatient Hospital Stay (HOSPITAL_COMMUNITY): Payer: Medicare Other

## 2018-11-16 ENCOUNTER — Other Ambulatory Visit: Payer: Self-pay

## 2018-11-16 DIAGNOSIS — Z7982 Long term (current) use of aspirin: Secondary | ICD-10-CM | POA: Diagnosis not present

## 2018-11-16 DIAGNOSIS — T148XXA Other injury of unspecified body region, initial encounter: Secondary | ICD-10-CM | POA: Diagnosis not present

## 2018-11-16 DIAGNOSIS — E872 Acidosis, unspecified: Secondary | ICD-10-CM

## 2018-11-16 DIAGNOSIS — N184 Chronic kidney disease, stage 4 (severe): Secondary | ICD-10-CM | POA: Diagnosis present

## 2018-11-16 DIAGNOSIS — Y9301 Activity, walking, marching and hiking: Secondary | ICD-10-CM | POA: Diagnosis present

## 2018-11-16 DIAGNOSIS — Y92019 Unspecified place in single-family (private) house as the place of occurrence of the external cause: Secondary | ICD-10-CM | POA: Diagnosis not present

## 2018-11-16 DIAGNOSIS — Z720 Tobacco use: Secondary | ICD-10-CM

## 2018-11-16 DIAGNOSIS — S72011A Unspecified intracapsular fracture of right femur, initial encounter for closed fracture: Secondary | ICD-10-CM | POA: Diagnosis present

## 2018-11-16 DIAGNOSIS — Z09 Encounter for follow-up examination after completed treatment for conditions other than malignant neoplasm: Secondary | ICD-10-CM

## 2018-11-16 DIAGNOSIS — Z8673 Personal history of transient ischemic attack (TIA), and cerebral infarction without residual deficits: Secondary | ICD-10-CM

## 2018-11-16 DIAGNOSIS — I129 Hypertensive chronic kidney disease with stage 1 through stage 4 chronic kidney disease, or unspecified chronic kidney disease: Secondary | ICD-10-CM | POA: Diagnosis present

## 2018-11-16 DIAGNOSIS — F17210 Nicotine dependence, cigarettes, uncomplicated: Secondary | ICD-10-CM | POA: Diagnosis present

## 2018-11-16 DIAGNOSIS — Z79899 Other long term (current) drug therapy: Secondary | ICD-10-CM

## 2018-11-16 DIAGNOSIS — S72009A Fracture of unspecified part of neck of unspecified femur, initial encounter for closed fracture: Secondary | ICD-10-CM

## 2018-11-16 DIAGNOSIS — Z66 Do not resuscitate: Secondary | ICD-10-CM | POA: Diagnosis present

## 2018-11-16 DIAGNOSIS — N179 Acute kidney failure, unspecified: Secondary | ICD-10-CM | POA: Insufficient documentation

## 2018-11-16 DIAGNOSIS — N189 Chronic kidney disease, unspecified: Secondary | ICD-10-CM

## 2018-11-16 DIAGNOSIS — S72001A Fracture of unspecified part of neck of right femur, initial encounter for closed fracture: Secondary | ICD-10-CM

## 2018-11-16 DIAGNOSIS — I10 Essential (primary) hypertension: Secondary | ICD-10-CM | POA: Diagnosis not present

## 2018-11-16 DIAGNOSIS — W010XXA Fall on same level from slipping, tripping and stumbling without subsequent striking against object, initial encounter: Secondary | ICD-10-CM | POA: Diagnosis present

## 2018-11-16 DIAGNOSIS — Q613 Polycystic kidney, unspecified: Secondary | ICD-10-CM | POA: Diagnosis not present

## 2018-11-16 DIAGNOSIS — R296 Repeated falls: Secondary | ICD-10-CM | POA: Diagnosis present

## 2018-11-16 LAB — TYPE AND SCREEN
ABO/RH(D): O POS
Antibody Screen: NEGATIVE

## 2018-11-16 LAB — BASIC METABOLIC PANEL
Anion gap: 10 (ref 5–15)
BUN: 35 mg/dL — ABNORMAL HIGH (ref 8–23)
CO2: 19 mmol/L — ABNORMAL LOW (ref 22–32)
Calcium: 8.8 mg/dL — ABNORMAL LOW (ref 8.9–10.3)
Chloride: 106 mmol/L (ref 98–111)
Creatinine, Ser: 2.44 mg/dL — ABNORMAL HIGH (ref 0.61–1.24)
GFR calc Af Amer: 27 mL/min — ABNORMAL LOW (ref >60)
GFR calc non Af Amer: 24 mL/min — ABNORMAL LOW (ref >60)
Glucose, Bld: 120 mg/dL — ABNORMAL HIGH (ref 70–99)
Potassium: 4.3 mmol/L (ref 3.5–5.1)
Sodium: 135 mmol/L (ref 135–145)

## 2018-11-16 LAB — CBC
HCT: 38.5 % — ABNORMAL LOW (ref 39.0–52.0)
Hemoglobin: 12.7 g/dL — ABNORMAL LOW (ref 13.0–17.0)
MCH: 30.8 pg (ref 26.0–34.0)
MCHC: 33 g/dL (ref 30.0–36.0)
MCV: 93.4 fL (ref 80.0–100.0)
Platelets: 163 10*3/uL (ref 150–400)
RBC: 4.12 MIL/uL — ABNORMAL LOW (ref 4.22–5.81)
RDW: 12.8 % (ref 11.5–15.5)
WBC: 11.8 10*3/uL — ABNORMAL HIGH (ref 4.0–10.5)
nRBC: 0 % (ref 0.0–0.2)

## 2018-11-16 MED ORDER — HYDRALAZINE HCL 20 MG/ML IJ SOLN
5.0000 mg | INTRAMUSCULAR | Status: DC | PRN
  Administered 2018-11-17 – 2018-11-18 (×3): 5 mg via INTRAVENOUS
  Filled 2018-11-16 (×3): qty 1

## 2018-11-16 MED ORDER — ACETAMINOPHEN 325 MG PO TABS: 650.0000 mg | ORAL_TABLET | Freq: Four times a day (QID) | ORAL | Status: DC | PRN

## 2018-11-16 MED ORDER — CEFAZOLIN SODIUM-DEXTROSE 2-4 GM/100ML-% IV SOLN
2.0000 g | INTRAVENOUS | Status: AC
  Administered 2018-11-17: 2 g via INTRAVENOUS
  Filled 2018-11-16: qty 100

## 2018-11-16 MED ORDER — ACETAMINOPHEN 650 MG RE SUPP: 650.0000 mg | Freq: Four times a day (QID) | RECTAL | Status: DC | PRN

## 2018-11-16 MED ORDER — OXYCODONE HCL 5 MG PO TABS
5.0000 mg | ORAL_TABLET | ORAL | Status: DC | PRN
  Administered 2018-11-16: 5 mg via ORAL
  Filled 2018-11-16: qty 1

## 2018-11-16 MED ORDER — HYDROMORPHONE HCL 1 MG/ML IJ SOLN
0.5000 mg | INTRAMUSCULAR | Status: DC | PRN
  Administered 2018-11-17: 0.5 mg via INTRAVENOUS
  Filled 2018-11-16: qty 1

## 2018-11-16 MED ORDER — AMLODIPINE BESYLATE 2.5 MG PO TABS: 2.5000 mg | ORAL_TABLET | Freq: Every day | ORAL | Status: DC

## 2018-11-16 MED ORDER — SENNOSIDES-DOCUSATE SODIUM 8.6-50 MG PO TABS: 1.0000 | ORAL_TABLET | Freq: Every evening | ORAL | Status: DC | PRN

## 2018-11-16 MED ORDER — CHLORHEXIDINE GLUCONATE 4 % EX LIQD
60.0000 mL | Freq: Once | CUTANEOUS | Status: AC
  Administered 2018-11-16: 4 via TOPICAL
  Filled 2018-11-16: qty 60

## 2018-11-16 MED ORDER — SODIUM CHLORIDE 0.9% FLUSH
3.0000 mL | Freq: Two times a day (BID) | INTRAVENOUS | Status: DC
  Administered 2018-11-16 – 2018-11-17 (×2): 3 mL via INTRAVENOUS

## 2018-11-16 MED ORDER — AMLODIPINE BESYLATE 10 MG PO TABS
10.0000 mg | ORAL_TABLET | Freq: Every day | ORAL | Status: DC
  Administered 2018-11-17 – 2018-11-18 (×2): 10 mg via ORAL
  Filled 2018-11-16 (×2): qty 1

## 2018-11-16 MED ORDER — MUPIROCIN 2 % EX OINT
1.0000 "application " | TOPICAL_OINTMENT | Freq: Two times a day (BID) | CUTANEOUS | Status: DC
  Administered 2018-11-16 – 2018-11-18 (×4): 1 via NASAL
  Filled 2018-11-16 (×2): qty 22

## 2018-11-16 MED ORDER — SODIUM CHLORIDE 0.9 % IV SOLN
INTRAVENOUS | Status: AC
  Administered 2018-11-16: via INTRAVENOUS

## 2018-11-16 NOTE — Consult Note (Signed)
Patient with R impacted femoral neck fracture.  Had pinning remotely of contralateral side.  Plan for CRPP of R hip tomorrow morning.  Discussed patient with hospitalist.    Will meet patient in AM, formal consult to follow Need new XR of R hip to ensure fracture didn't displace during transport.  Ordered

## 2018-11-16 NOTE — H&P (Addendum)
History and Physical    [name]:[id number] DOB: 1933-11-05 DOA: 11/16/2018  PCP: Cyndi Bender, PA-C  Patient coming from: [facility] emergency department  I have personally briefly reviewed patient's old medical records in [place]  Chief Complaint: Fall, right hip pain  HPI: Larry Aguilar is a 85 y.o. male with medical history significant for hypertension, history of CVA, CKD stage III, AAA s/p endovascular stent graft 04/2017, frequent falls, and chronic tobacco use who initially presented to [facility] emergency department after a fall.  He says he has frequent falls that occur suddenly without warning.  He says this morning he fell while walking across the room without any associated symptoms such as chest pain, palpitations, dyspnea, lightheadedness, dizziness, nausea, vomiting, or seizure activity.  He fell on his right side and had immediate severe pain at his right hip.  He denies hitting his head or any loss of consciousness.  [facility] ED Course:  Initial vitals showed BP 190/105, pulse 78, RR 19, temp 97.8 Fahrenheit, SPO2 94% on room air.  Labs are notable for WBC 8.2, hemoglobin 14.6, platelets 153, PT 10.8, INR 0.94, sodium 136, potassium 4.7, chloride 106, bicarb 20, BUN 37, creatinine 2.4, GFR 24, serum glucose 102.  X-ray of the right hip showed a cortical angulation laterally in the right femoral neck concerning for nondisplaced fracture.  CT of the right hip without contrast showed a slightly impacted subcapital fracture of the right femoral neck.  Patient was given IV morphine and Dilaudid for pain and oral amlodipine and lisinopril.  The ED provider discussed the case with on-call orthopedics at [facility] (Dr. Griffin Basil) who recommended transfer for likely surgical intervention on 11/17/2018.  Review of Systems: As per HPI otherwise 10 point review of systems negative.    Past Medical History:  Diagnosis Date  . AAA (abdominal aortic aneurysm) ([place])   .  CKD (chronic kidney disease), stage III ([place])   . Hypertension   . Kidney disease    Left kidney mass,  polycystic kidney  . Stroke ([place])   . Syncope     Past Surgical History:  Procedure Laterality Date  . ABDOMINAL AORTIC ENDOVASCULAR STENT GRAFT N/A 05/11/2017   Procedure: ABDOMINAL AORTIC ENDOVASCULAR STENT GRAFT- WITH ENDOANCHOR;  Surgeon: Waynetta Sandy, MD;  Location: [place];  Service: Vascular;  Laterality: N/A;  . AORTOGRAM N/A 05/11/2017   Procedure: AORTOGRAM with RIGHT LOWER EXTREMITY STENT;  Surgeon: Waynetta Sandy, MD;  Location: [place];  Service: Vascular;  Laterality: N/A;  . APPENDECTOMY    . CARDIOVASCULAR STRESS TEST  06/12/2012   Lexiscan, no evidence of meaningful reversible perfusion abnormalities, no evidence of myocardial ischemia, fixed septal defect - related to an artifact.  Marland Kitchen EYE SURGERY Bilateral    cataracts surgery  . FRACTURE SURGERY Left    ORIF femur   . HEMORRHOID SURGERY    . INGUINAL HERNIA REPAIR Bilateral 2014  . SPINE SURGERY     By Dr. Hardin Negus   . THYROID SURGERY     For Goiter removal      reports that he has been smoking. He has been smoking about 2.00 packs per day. He has never used smokeless tobacco. He reports that he does not drink alcohol or use drugs.  Allergies  Allergen Reactions  . Penicillins     Family History  Problem Relation Age of Onset  . Heart attack Father   . Heart disease Father   . Hyperlipidemia Father   .  Hypertension Father   . Cancer Mother        lymphoma   . Heart disease Mother   . Hyperlipidemia Mother   . Hypertension Mother   . Cancer Daughter        Thyroid cancer   . Hypertension Son   . Diabetes Son   . Hypertension Daughter      Prior to Admission medications   Medication Sig Start Date End Date Taking? Authorizing Provider  amLODipine (NORVASC) 2.5 MG tablet Take 2.5 mg by mouth daily.     [provider]  aspirin 81 MG tablet Take 81 mg by mouth daily.     [provider]  lisinopril (PRINIVIL,ZESTRIL) 20 MG tablet Take 20 mg by mouth daily.    [provider]  meclizine (ANTIVERT) 25 MG tablet Take 25 mg by mouth 3 (three) times daily as needed for dizziness.    [provider]  oxyCODONE-acetaminophen (ROXICET) 5-325 MG tablet Take 1 tablet by mouth every 6 (six) hours as needed for severe pain. 05/11/17   Gabriel Earing, PA-C    Physical Exam: Vitals:   11/16/18 1851  BP: (!) 163/99  Pulse: 76  Resp: (!) 21  Temp: 97.9 F (36.6 C)  TempSrc: Oral  SpO2: 98%    Constitutional: Elderly man resting supine in bed, calm, hard of hearing, comfortable except for when moving right lower extremity Eyes: PERRL, lids and conjunctivae normal ENMT: Mucous membranes are moist. Posterior pharynx clear of any exudate or lesions. Neck: normal, supple, no masses. Respiratory: clear to auscultation anteriorly, no wheezing, no crackles. Normal respiratory effort. No accessory muscle use.  Cardiovascular: Regular rate and rhythm, no murmurs / rubs / gallops. No extremity edema.  Abdomen: no tenderness, no masses palpated. No hepatosplenomegaly. Bowel sounds positive.  Musculoskeletal: no clubbing / cyanosis. No joint deformity upper and lower extremities. Good ROM upper extremities, limited in both lower extremities due to pain from right femoral fracture.  No contractures.  Right foot everted. Skin: no rashes, lesions, ulcers. No induration Neurologic: CN 2-12 grossly intact. Sensation intact, Strength 5/5 in upper extremities and LLE.  Not tested in RLE due to significant pain with movement. Psychiatric: Normal judgment and insight. Alert and oriented x 3. Normal mood.   Labs on Admission: I have personally reviewed following labs and imaging studies  CBC: No results for input(s): WBC, NEUTROABS, HGB, HCT, MCV, PLT in the last 168 hours. Basic Metabolic Panel: No results for input(s): NA, K, CL, CO2, GLUCOSE, BUN,  CREATININE, CALCIUM, MG, PHOS in the last 168 hours. GFR: CrCl cannot be calculated (Patient's most recent lab result is older than the maximum 21 days allowed.). Liver Function Tests: No results for input(s): AST, ALT, ALKPHOS, BILITOT, PROT, ALBUMIN in the last 168 hours. No results for input(s): LIPASE, AMYLASE in the last 168 hours. No results for input(s): AMMONIA in the last 168 hours. Coagulation Profile: No results for input(s): INR, PROTIME in the last 168 hours. Cardiac Enzymes: No results for input(s): CKTOTAL, CKMB, CKMBINDEX, TROPONINI in the last 168 hours. BNP (last 3 results) No results for input(s): PROBNP in the last 8760 hours. HbA1C: No results for input(s): HGBA1C in the last 72 hours. CBG: No results for input(s): GLUCAP in the last 168 hours. Lipid Profile: No results for input(s): CHOL, HDL, LDLCALC, TRIG, CHOLHDL, LDLDIRECT in the last 72 hours. Thyroid Function Tests: No results for input(s): TSH, T4TOTAL, FREET4, T3FREE, THYROIDAB in the last 72 hours. Anemia Panel:  No results for input(s): VITAMINB12, FOLATE, FERRITIN, TIBC, IRON, RETICCTPCT in the last 72 hours. Urine analysis:    Component Value Date/Time   COLORURINE YELLOW 05/10/2017 0935   APPEARANCEUR CLEAR 05/10/2017 0935   LABSPEC 1.012 05/10/2017 0935   PHURINE 5.0 05/10/2017 0935   GLUCOSEU NEGATIVE 05/10/2017 0935   HGBUR NEGATIVE 05/10/2017 0935   BILIRUBINUR NEGATIVE 05/10/2017 0935   KETONESUR NEGATIVE 05/10/2017 0935   PROTEINUR 30 (A) 05/10/2017 0935   UROBILINOGEN 0.2 04/07/2007 1446   NITRITE NEGATIVE 05/10/2017 0935   LEUKOCYTESUR NEGATIVE 05/10/2017 0935    Radiological Exams on Admission: No results found.  Interface, Rad Results In - 11/16/2018  2:14 PM EDT CLINICAL DATA:  Right hip pain secondary to a fall today.  EXAM: CT OF THE RIGHT HIP WITHOUT CONTRAST  TECHNIQUE: Multidetector CT imaging of the right hip was performed according to the standard protocol.  Multiplanar CT image reconstructions were also generated.  COMPARISON:  Radiographs dated 11/16/2018 and 12/28/2017  FINDINGS: Bones/Joint/Cartilage  There is an impacted subcapital fracture of the right femoral neck. No displacement. No dislocation.  There are only minimal arthritic changes of the right hip joint. The visualized pelvic bones are intact.  Muscles and Tendons  Normal.  Soft tissues  Minimal subcutaneous edema in the soft tissues lateral to the right greater trochanter consistent with soft tissue contusion.  IMPRESSION: Slightly impacted subcapital fracture of the right femoral neck.   Electronically Signed   By: Lorriane Shire M.D.   On: 11/16/2018 14:11  EKG: Ordered and pending  Assessment/Plan Principal Problem:   Closed fracture of right hip (HCC) Active Problems:   Acute-on-chronic kidney injury ([place])   Essential hypertension   History of CVA (cerebrovascular accident)   Tobacco use  Larry Aguilar is a 85 y.o. male with medical history significant for hypertension, history of CVA, CKD stage III, AAA s/p endovascular stent graft 04/2017, frequent falls, and chronic tobacco use who is admitted with an impacted subcapital fracture of the right femoral neck.   Impacted subcapital fracture of the right femoral neck: Suspect secondary to mechanical fall versus chronic frequent falls due to to old left cerebellar CVA. Based on the revised cardiac risk index he has a 10.1% preoperative 30-day risk of death, MI, or cardiac arrest given history of CVA and CKD. -Discussed with orthopedics, Dr. Griffin Basil, tentative plan for OR in a.m. -N.p.o. at midnight -IV fluids @ 100/mL overnight -Continue pain control -Type and screen  Acute on chronic stage III kidney injury: BUN 37, creatinine 2.4, GFR 24 at outside hospital.  No recent labs, baseline creatinine 2 years ago was between 1.6-1.8 with GFR in the 30s. -Continue IV fluids overnight -Recheck labs in a.m.  -Hold lisinopril, avoid NSAIDs and nephrotoxic agents  Sinus bradycardia, first-degree AV block on prior EKG: Appears to be in sinus rhythm on admission.  Patient had a low risk stress test in 2013. -Check EKG -Monitor on telemetry overnight  Hypertension: BP elevated in the setting of pain. -Resume home amlodipine 10 mg daily -IV hydralazine as needed -Holding lisinopril as above  History of CVA: Review of prior images show an old left cerebellar infarct, suspect contributing to patient's frequent falls. -Hold aspirin for for planned surgical intervention  Tobacco use: Patient is a chronic smoker since age 43. -Patient declines nicotine patch  DVT prophylaxis: SCDs Code Status: DNR, confirmed with patient Family Communication: None present on admission Disposition Plan: Pending surgical intervention, postop recovery, and subsequent PT/OT eval Consults  called: Orthopedics Admission status: Inpatient   Zada Finders MD [facility] Pager [phone]  If 7PM-7AM, please contact night-coverage [email or web address]  11/16/2018, 7:56 PM

## 2018-11-17 ENCOUNTER — Inpatient Hospital Stay (HOSPITAL_COMMUNITY): Payer: Medicare Other | Admitting: Certified Registered"

## 2018-11-17 ENCOUNTER — Encounter (HOSPITAL_COMMUNITY)
Admission: AD | Disposition: A | Discharge status: Home Health | Payer: Self-pay | Source: Other Acute Inpatient Hospital | Attending: Internal Medicine

## 2018-11-17 ENCOUNTER — Other Ambulatory Visit: Payer: Self-pay

## 2018-11-17 ENCOUNTER — Inpatient Hospital Stay (HOSPITAL_COMMUNITY): Payer: Medicare Other

## 2018-11-17 DIAGNOSIS — Q613 Polycystic kidney, unspecified: Secondary | ICD-10-CM

## 2018-11-17 DIAGNOSIS — Z8673 Personal history of transient ischemic attack (TIA), and cerebral infarction without residual deficits: Secondary | ICD-10-CM

## 2018-11-17 DIAGNOSIS — Z720 Tobacco use: Secondary | ICD-10-CM

## 2018-11-17 DIAGNOSIS — I10 Essential (primary) hypertension: Secondary | ICD-10-CM

## 2018-11-17 DIAGNOSIS — N184 Chronic kidney disease, stage 4 (severe): Secondary | ICD-10-CM

## 2018-11-17 HISTORY — PX: HIP PINNING,CANNULATED: SHX1758

## 2018-11-17 LAB — BASIC METABOLIC PANEL
Anion gap: 9 (ref 5–15)
BUN: 35 mg/dL — ABNORMAL HIGH (ref 8–23)
CO2: 19 mmol/L — ABNORMAL LOW (ref 22–32)
Calcium: 8.7 mg/dL — ABNORMAL LOW (ref 8.9–10.3)
Chloride: 109 mmol/L (ref 98–111)
Creatinine, Ser: 2.4 mg/dL — ABNORMAL HIGH (ref 0.61–1.24)
GFR calc Af Amer: 28 mL/min — ABNORMAL LOW (ref >60)
GFR calc non Af Amer: 24 mL/min — ABNORMAL LOW (ref >60)
Glucose, Bld: 109 mg/dL — ABNORMAL HIGH (ref 70–99)
Potassium: 4.7 mmol/L (ref 3.5–5.1)
Sodium: 137 mmol/L (ref 135–145)

## 2018-11-17 LAB — CBC
HCT: 38.6 % — ABNORMAL LOW (ref 39.0–52.0)
Hemoglobin: 12.7 g/dL — ABNORMAL LOW (ref 13.0–17.0)
MCH: 30.9 pg (ref 26.0–34.0)
MCHC: 32.9 g/dL (ref 30.0–36.0)
MCV: 93.9 fL (ref 80.0–100.0)
Platelets: 158 10*3/uL (ref 150–400)
RBC: 4.11 MIL/uL — ABNORMAL LOW (ref 4.22–5.81)
RDW: 12.8 % (ref 11.5–15.5)
WBC: 9.6 10*3/uL (ref 4.0–10.5)
nRBC: 0 % (ref 0.0–0.2)

## 2018-11-17 LAB — SURGICAL PCR SCREEN
MRSA, PCR: POSITIVE — AB
Staphylococcus aureus: POSITIVE — AB

## 2018-11-17 SURGERY — FIXATION, FEMUR, NECK, PERCUTANEOUS, USING SCREW
Anesthesia: General | Laterality: Right

## 2018-11-17 MED ORDER — HYDROMORPHONE HCL 1 MG/ML IJ SOLN
0.5000 mg | INTRAMUSCULAR | Status: DC | PRN
  Administered 2018-11-18: 1 mg via INTRAVENOUS
  Filled 2018-11-17: qty 1

## 2018-11-17 MED ORDER — FENTANYL CITRATE (PF) 100 MCG/2ML IJ SOLN
25.0000 ug | INTRAMUSCULAR | Status: DC | PRN
  Administered 2018-11-17 (×2): 50 ug via INTRAVENOUS
  Administered 2018-11-17 (×2): 25 ug via INTRAVENOUS

## 2018-11-17 MED ORDER — FENTANYL CITRATE (PF) 250 MCG/5ML IJ SOLN
INTRAMUSCULAR | Status: AC
  Filled 2018-11-17: qty 5

## 2018-11-17 MED ORDER — CELECOXIB 200 MG PO CAPS
200.0000 mg | ORAL_CAPSULE | Freq: Two times a day (BID) | ORAL | Status: DC
  Administered 2018-11-17 – 2018-11-18 (×3): 200 mg via ORAL
  Filled 2018-11-17 (×3): qty 1

## 2018-11-17 MED ORDER — 0.9 % SODIUM CHLORIDE (POUR BTL) OPTIME
TOPICAL | Status: DC | PRN
  Administered 2018-11-17: 1000 mL

## 2018-11-17 MED ORDER — ACETAMINOPHEN 500 MG PO TABS
1000.0000 mg | ORAL_TABLET | Freq: Three times a day (TID) | ORAL | Status: DC
  Administered 2018-11-17 – 2018-11-18 (×3): 1000 mg via ORAL
  Filled 2018-11-17 (×3): qty 2

## 2018-11-17 MED ORDER — OXYCODONE HCL 5 MG/5ML PO SOLN: 5.0000 mg | Freq: Once | ORAL | Status: AC | PRN

## 2018-11-17 MED ORDER — MENTHOL 3 MG MT LOZG: 1.0000 | LOZENGE | OROMUCOSAL | Status: DC | PRN

## 2018-11-17 MED ORDER — OXYCODONE HCL 5 MG PO TABS
ORAL_TABLET | ORAL | Status: AC
  Filled 2018-11-17: qty 1

## 2018-11-17 MED ORDER — ROCURONIUM BROMIDE 10 MG/ML (PF) SYRINGE
PREFILLED_SYRINGE | INTRAVENOUS | Status: DC | PRN
  Administered 2018-11-17: 40 mg via INTRAVENOUS

## 2018-11-17 MED ORDER — PROPOFOL 10 MG/ML IV BOLUS
INTRAVENOUS | Status: DC | PRN
  Administered 2018-11-17: 70 mg via INTRAVENOUS

## 2018-11-17 MED ORDER — OXYCODONE HCL 5 MG PO TABS
5.0000 mg | ORAL_TABLET | ORAL | Status: DC | PRN
  Administered 2018-11-17: 10 mg via ORAL
  Filled 2018-11-17: qty 2

## 2018-11-17 MED ORDER — LIDOCAINE 2% (20 MG/ML) 5 ML SYRINGE
INTRAMUSCULAR | Status: AC
  Filled 2018-11-17: qty 5

## 2018-11-17 MED ORDER — LIDOCAINE 2% (20 MG/ML) 5 ML SYRINGE
INTRAMUSCULAR | Status: DC | PRN
  Administered 2018-11-17: 60 mg via INTRAVENOUS

## 2018-11-17 MED ORDER — ONDANSETRON HCL 4 MG/2ML IJ SOLN
INTRAMUSCULAR | Status: AC
  Filled 2018-11-17: qty 2

## 2018-11-17 MED ORDER — ROCURONIUM BROMIDE 50 MG/5ML IV SOSY
PREFILLED_SYRINGE | INTRAVENOUS | Status: AC
  Filled 2018-11-17: qty 5

## 2018-11-17 MED ORDER — PROPOFOL 500 MG/50ML IV EMUL
INTRAVENOUS | Status: DC | PRN
  Administered 2018-11-17: 100 ug/kg/min via INTRAVENOUS

## 2018-11-17 MED ORDER — ENOXAPARIN SODIUM 30 MG/0.3ML ~~LOC~~ SOLN
30.0000 mg | SUBCUTANEOUS | Status: DC
  Administered 2018-11-18: 30 mg via SUBCUTANEOUS
  Filled 2018-11-17: qty 0.3

## 2018-11-17 MED ORDER — ONDANSETRON HCL 4 MG/2ML IJ SOLN
INTRAMUSCULAR | Status: DC | PRN
  Administered 2018-11-17: 4 mg via INTRAVENOUS

## 2018-11-17 MED ORDER — FENTANYL CITRATE (PF) 100 MCG/2ML IJ SOLN
INTRAMUSCULAR | Status: AC
  Filled 2018-11-17: qty 2

## 2018-11-17 MED ORDER — FENTANYL CITRATE (PF) 100 MCG/2ML IJ SOLN
INTRAMUSCULAR | Status: DC | PRN
  Administered 2018-11-17: 25 ug via INTRAVENOUS
  Administered 2018-11-17: 100 ug via INTRAVENOUS
  Administered 2018-11-17: 25 ug via INTRAVENOUS

## 2018-11-17 MED ORDER — ONDANSETRON HCL 4 MG/2ML IJ SOLN: 4.0000 mg | Freq: Four times a day (QID) | INTRAMUSCULAR | Status: DC | PRN

## 2018-11-17 MED ORDER — ONDANSETRON HCL 4 MG PO TABS: 4.0000 mg | ORAL_TABLET | Freq: Four times a day (QID) | ORAL | Status: DC | PRN

## 2018-11-17 MED ORDER — ACETAMINOPHEN 10 MG/ML IV SOLN
INTRAVENOUS | Status: AC
  Filled 2018-11-17: qty 100

## 2018-11-17 MED ORDER — DOCUSATE SODIUM 100 MG PO CAPS
100.0000 mg | ORAL_CAPSULE | Freq: Two times a day (BID) | ORAL | Status: DC
  Administered 2018-11-17 – 2018-11-18 (×3): 100 mg via ORAL
  Filled 2018-11-17 (×3): qty 1

## 2018-11-17 MED ORDER — PHENOL 1.4 % MT LIQD: 1.0000 | OROMUCOSAL | Status: DC | PRN

## 2018-11-17 MED ORDER — SUGAMMADEX SODIUM 200 MG/2ML IV SOLN
INTRAVENOUS | Status: DC | PRN
  Administered 2018-11-17: 160 mg via INTRAVENOUS

## 2018-11-17 MED ORDER — GLYCOPYRROLATE PF 0.2 MG/ML IJ SOSY
PREFILLED_SYRINGE | INTRAMUSCULAR | Status: AC
  Filled 2018-11-17: qty 1

## 2018-11-17 MED ORDER — SODIUM CHLORIDE 0.9 % IV SOLN
INTRAVENOUS | Status: DC
  Administered 2018-11-17: 09:00:00 via INTRAVENOUS

## 2018-11-17 MED ORDER — PROPOFOL 10 MG/ML IV BOLUS
INTRAVENOUS | Status: AC
  Filled 2018-11-17: qty 20

## 2018-11-17 MED ORDER — OXYCODONE HCL 5 MG PO TABS
5.0000 mg | ORAL_TABLET | Freq: Once | ORAL | Status: AC | PRN
  Administered 2018-11-17: 5 mg via ORAL

## 2018-11-17 MED ORDER — CEFAZOLIN SODIUM-DEXTROSE 2-4 GM/100ML-% IV SOLN
2.0000 g | Freq: Four times a day (QID) | INTRAVENOUS | Status: AC
  Administered 2018-11-17 (×2): 2 g via INTRAVENOUS
  Filled 2018-11-17 (×2): qty 100

## 2018-11-17 MED ORDER — BUPIVACAINE HCL (PF) 0.25 % IJ SOLN
INTRAMUSCULAR | Status: AC
  Filled 2018-11-17: qty 30

## 2018-11-17 MED ORDER — ONDANSETRON HCL 4 MG/2ML IJ SOLN: 4.0000 mg | Freq: Once | INTRAMUSCULAR | Status: DC | PRN

## 2018-11-17 MED ORDER — NICOTINE 21 MG/24HR TD PT24
21.0000 mg | MEDICATED_PATCH | Freq: Every day | TRANSDERMAL | Status: DC
  Administered 2018-11-17 – 2018-11-18 (×2): 21 mg via TRANSDERMAL
  Filled 2018-11-17 (×2): qty 1

## 2018-11-17 SURGICAL SUPPLY — 34 items
BIT DRILL 4.8X300 (BIT) ×2 IMPLANT
BIT DRILL 4.8X300MM (BIT) ×1
BNDG COHESIVE 6X5 TAN STRL LF (GAUZE/BANDAGES/DRESSINGS) ×3 IMPLANT
CLOSURE STERI-STRIP 1/2X4 (GAUZE/BANDAGES/DRESSINGS) ×1
CLSR STERI-STRIP ANTIMIC 1/2X4 (GAUZE/BANDAGES/DRESSINGS) ×2 IMPLANT
COVER PERINEAL POST (MISCELLANEOUS) ×3 IMPLANT
COVER SURGICAL LIGHT HANDLE (MISCELLANEOUS) ×3 IMPLANT
DRAPE STERI IOBAN 125X83 (DRAPES) ×3 IMPLANT
DRSG AQUACEL AG ADV 3.5X 4 (GAUZE/BANDAGES/DRESSINGS) ×3 IMPLANT
ELECT REM PT RETURN 9FT ADLT (ELECTROSURGICAL) ×3
ELECTRODE REM PT RTRN 9FT ADLT (ELECTROSURGICAL) ×1 IMPLANT
GLOVE SURG ORTHO 8.0 STRL STRW (GLOVE) ×3 IMPLANT
GOWN STRL REUS W/ TWL XL LVL3 (GOWN DISPOSABLE) ×2 IMPLANT
GOWN STRL REUS W/TWL 2XL LVL3 (GOWN DISPOSABLE) ×3 IMPLANT
GOWN STRL REUS W/TWL XL LVL3 (GOWN DISPOSABLE) ×4
KIT BASIN OR (CUSTOM PROCEDURE TRAY) ×3 IMPLANT
KIT TURNOVER KIT B (KITS) ×3 IMPLANT
NS IRRIG 1000ML POUR BTL (IV SOLUTION) ×3 IMPLANT
PACK GENERAL/GYN (CUSTOM PROCEDURE TRAY) ×3 IMPLANT
PAD ARMBOARD 7.5X6 YLW CONV (MISCELLANEOUS) ×6 IMPLANT
PIN GUIDE DRILL TIP 2.8X300 (DRILL) ×9 IMPLANT
SCREW CANN 8.0X100 HIP (Screw) ×3 IMPLANT
SCREW CANN FT 95X8 NS LNG (Screw) ×2 IMPLANT
SCREW CANNULATED 8.0X95 (Screw) ×4 IMPLANT
SUT MNCRL AB 4-0 PS2 18 (SUTURE) ×3 IMPLANT
SUT VIC AB 0 CT1 27 (SUTURE) ×2
SUT VIC AB 0 CT1 27XBRD ANBCTR (SUTURE) ×1 IMPLANT
SUT VIC AB 2-0 SH 27 (SUTURE) ×2
SUT VIC AB 2-0 SH 27XBRD (SUTURE) ×1 IMPLANT
TOWEL OR 17X24 6PK STRL BLUE (TOWEL DISPOSABLE) ×3 IMPLANT
TOWEL OR 17X26 10 PK STRL BLUE (TOWEL DISPOSABLE) ×3 IMPLANT
WASHER 8.0 (Orthopedic Implant) ×6 IMPLANT
WASHER CANN FLAT 8 (Orthopedic Implant) ×3 IMPLANT
WATER STERILE IRR 1000ML POUR (IV SOLUTION) ×3 IMPLANT

## 2018-11-17 NOTE — Progress Notes (Signed)
Dr. Griffin Basil aware of PCN allergy. Ok to give ancef as ordered.

## 2018-11-17 NOTE — Anesthesia Preprocedure Evaluation (Addendum)
Anesthesia Evaluation  Patient identified by MRN, date of birth, ID band Patient awake    Reviewed: Allergy & Precautions, NPO status , Patient's Chart, lab work & pertinent test results  History of Anesthesia Complications Negative for: history of anesthetic complications  Airway Mallampati: III  TM Distance: >3 FB Neck ROM: Full    Dental  (+) Dental Advisory Given   Pulmonary neg pulmonary ROS,    breath sounds clear to auscultation       Cardiovascular Exercise Tolerance: Good hypertension, Pt. on medications  Rhythm:Regular Rate:Normal   Hx Abdominal aortic aneurysm s/p EVAR     Neuro/Psych CVA (left arm weakness), Residual Symptoms negative psych ROS   GI/Hepatic negative GI ROS, Neg liver ROS,   Endo/Other  negative endocrine ROS  Renal/GU CRFRenal disease Polycystic kidney      Musculoskeletal negative musculoskeletal ROS (+)   Abdominal   Peds  Hematology negative hematology ROS (+)   Anesthesia Other Findings   Reproductive/Obstetrics                            Anesthesia Physical Anesthesia Plan  ASA: III and emergent  Anesthesia Plan: General   Post-op Pain Management:    Induction: Intravenous  PONV Risk Score and Plan: 2 and Treatment may vary due to age or medical condition, Ondansetron and Propofol infusion  Airway Management Planned: Oral ETT  Additional Equipment: None  Intra-op Plan:   Post-operative Plan: Extubation in OR  Informed Consent: I have reviewed the patients History and Physical, chart, labs and discussed the procedure including the risks, benefits and alternatives for the proposed anesthesia with the patient or authorized representative who has indicated his/her understanding and acceptance.   Patient has DNR.  Discussed DNR with patient and Suspend DNR.   Dental advisory given  Plan Discussed with: CRNA and  Anesthesiologist  Anesthesia Plan Comments:        Anesthesia Quick Evaluation

## 2018-11-17 NOTE — Transfer of Care (Signed)
Immediate Anesthesia Transfer of Care Note  Patient: ASBERRY LASCOLA  Procedure(s) Performed: Right CANNULATED HIP PINNING (Right )  Patient Location: PACU  Anesthesia Type:General  Level of Consciousness: drowsy  Airway & Oxygen Therapy: Patient Spontanous Breathing and Patient connected to face mask oxygen  Post-op Assessment: Report given to RN and Post -op Vital signs reviewed and stable  Post vital signs: Reviewed and stable  Last Vitals:  Vitals Value Taken Time  BP 150/88 11/17/2018 10:50 AM  Temp    Pulse 85 11/17/2018 10:53 AM  Resp 22 11/17/2018 10:53 AM  SpO2 93 % 11/17/2018 10:53 AM  Vitals shown include unvalidated device data.  Last Pain:  Vitals:   11/17/18 0548  TempSrc: Oral         Complications: No apparent anesthesia complications

## 2018-11-17 NOTE — Op Note (Signed)
Orthopaedic Surgery Operative Note (CSN: [id number])  Larry Aguilar  1934-03-20 Date of Surgery: 11/17/2018   Diagnoses:  Right Femoral Neck Fracture impacted  Procedure: Right hip closed reduction and percutaneous pinning   Operative Finding Successful completion of planned procedure.  Bone quality was poor but the impacted fracture remained relatively stable compared with pictures at the outside hospital as well as the CT and the repeat films here.  3 good screws were obtained.  Post-operative plan: The patient will be weightbearing as tolerated.  The patient will be readmitted to floor.  DVT prophylaxis Lovenox 40 mg a day for 6 weeks.  Pain control with PRN pain medication preferring oral medicines.  Follow up plan will be scheduled in approximately 7 days for incision check and XR.  Post-Op Diagnosis: Same Surgeons:Primary: Hiram Gash, MD Assistants: Joya Gaskins, OPAC Location: [facility] OR ROOM 06 Anesthesia: Choice Antibiotics: Ancef 2g preop Tourniquet time: None Estimated Blood Loss:  Complications: None Specimens: None Implants: Implant Name Type Inv. Item Serial No. Manufacturer Lot No. LRB No. Used Action  SCREW CANN 8.0X100 HIP - [id number] Screw SCREW CANN 8.0X100 HIP  [employer](ORTH,TRAU,BIO,SG)  Right 1 Implanted  SCREW CANNULATED 8.0X95 - [id number] Screw SCREW CANNULATED 8.0X95  [employer]IM[employer]ON(ORTH,TRAU,BIO,SG)  Right 2 Implanted  WASHER 8.0 - [id number] Orthopedic Implant WASHER 8.0  ZIMMER [employer]ON(ORTH,TRAU,BIO,SG)  Right 3 Implanted    Indications for Surgery:   Larry Aguilar is a 85 y.o. male with fall from standing resulting in a right impacted femoral neck fracture.  On CT and images both at the outside hospital and here he fracture appeared to stay relatively stable.  He had had a CRP P on the contralateral side and done well remotely.  Based on this and his comorbidities we felt that another try of a CRP P would be appropriate.  We went over the risks with himself  as well as his daughter Kenney Houseman.  Benefits and risks of operative and nonoperative management were discussed prior to surgery with patient/guardian(s) and informed consent form was completed.  Specific risks including infection, need for additional surgery, avascular necrosis, hardware cut out, hardware prominence, infection and stiffness.   Procedure:   The patient was identified in the preoperative holding area where the surgical site was marked. The patient was taken to the OR where a procedural timeout was called and the above noted anesthesia was induced.  The patient was positioned supine on a fracture table.  Preoperative antibiotics were dosed.  The patient's right hip was prepped and draped in the usual sterile fashion.  A second preoperative timeout was called.      We began with fluoroscopy guiding the placement of a percutaneous pin starting distal to the femoral neck and going across the femoral neck across the fracture site.  We used orthogonal fluoroscopic views to make sure that this pin obtain purchase in the posterior inferior bone as the inferior of an inverted triangle screw pattern.  Once were happy with the placement of this pin we made a small incision and dissected through the subcutaneous tissue and IT band.  We then used pin bank guide to place the anterior superior and posterior superior screw wires.  Once were happy with the position of all of these wires we then measured and placed 3 accordingly sized 8.0 mm Biomet titanium cannulated screws with washers across the fracture site.  We did drill the near cortex prior to placing these.  We achieved reasonable bite with the inferior  screw and good bite with the superior 2 screws.  Around the world views then demonstrated that none of the screws perforated the subchondral bone.  Final fluoroscopic images demonstrated appropriate alignment.  The incision was thoroughly irrigated and closed in a multilayer fashion with absorbable  sutures. A sterile dressing was placed.   The patient was awoken from general anesthesia and taken to the PACU in stable condition without complication.   Joya Gaskins, OPA-C, present and scrubbed throughout the case, critical for completion in a timely fashion, and for retraction, instrumentation, closure.

## 2018-11-17 NOTE — Plan of Care (Signed)
Problem: Education: Goal: Knowledge of General Education information will improve Description Including pain rating scale, medication(s)/side effects and non-pharmacologic comfort measures Outcome: Progressing   Problem: Health Behavior/Discharge Planning: Goal: Ability to manage health-related needs will improve Outcome: Progressing   Problem: Clinical Measurements: Goal: Respiratory complications will improve Outcome: Progressing Goal: Cardiovascular complication will be avoided Outcome: Progressing   Problem: Nutrition: Goal: Adequate nutrition will be maintained Outcome: Progressing   Problem: Coping: Goal: Level of anxiety will decrease Outcome: Progressing   Problem: Pain Managment: Goal: General experience of comfort will improve Outcome: Progressing   Problem: Safety: Goal: Ability to remain free from injury will improve Outcome: Progressing   Problem: Skin Integrity: Goal: Risk for impaired skin integrity will decrease Outcome: Progressing

## 2018-11-17 NOTE — Anesthesia Postprocedure Evaluation (Signed)
Anesthesia Post Note  Patient: Larry Aguilar  Procedure(s) Performed: Right CANNULATED HIP PINNING (Right )     Patient location during evaluation: PACU Anesthesia Type: General Level of consciousness: awake and alert Pain management: pain level controlled Vital Signs Assessment: post-procedure vital signs reviewed and stable Respiratory status: spontaneous breathing, nonlabored ventilation, respiratory function stable and patient connected to nasal cannula oxygen Cardiovascular status: blood pressure returned to baseline and stable Postop Assessment: no apparent nausea or vomiting Anesthetic complications: no    Last Vitals:  Vitals:   11/17/18 1220 11/17/18 1235  BP: (!) 156/92   Pulse: 75   Resp: 15   Temp:  (!) 36.3 C  SpO2: 95%     Last Pain:  Vitals:   11/17/18 1200  TempSrc:   PainSc: [name]

## 2018-11-17 NOTE — Progress Notes (Signed)
Pt returned to 6N15 after surgery. Received report from [place], [place] in PACU. See reassessment. Will continue to monitor.

## 2018-11-17 NOTE — Progress Notes (Signed)
Pre-op report called and given to Robby, RN in [employer] All questions answered to satisfaction. OR personnel at bedside to transport pt.

## 2018-11-17 NOTE — Progress Notes (Signed)
PROGRESS NOTE    Larry Aguilar   SWN:[id number]  DOB: 05/27/1934  DOA: 11/16/2018 PCP: Cyndi Bender, PA-C   Brief Narrative:  Larry Aguilar is a 85 y.o. male with medical history significant for hypertension, history of CVA, CKD stage 4, AAA s/p endovascular stent graft 04/2017, frequent falls, and chronic tobacco use who initially presented to [facility] emergency department after a fall.  He says he has frequent falls that occur suddenly without warning. X-ray of the right hip showed a cortical angulation laterally in the right femoral neck concerning for nondisplaced fracture.  CT of the right hip without contrast showed a slightly impacted subcapital fracture of the right femoral neck.He was transferred to [facility] for surgery after discussion with dr Griffin Basil.   Subjective: Having pain in right hip and leg when moving it. No other complaints.     Assessment & Plan:   Principal Problem:   Closed fracture of right hip after fall/ frequent falls - for surgery today  Active Problems:   Essential hypertension - Hold Norvasc for now and follow BPs    History of CVA (cerebrovascular accident) - on ASA 81 mg at home    Tobacco use - life long- declined nicotine patch - needs COPD eval with PFTs    Polycystic kidney disease   CKD (chronic kidney disease) stage 4, GFR 15-29 ml/min (HCC) - prior imaging reveals numerous cysts - no prior Cr to compare with but has GFR < 30  Time spent in minutes: 35 DVT prophylaxis: per ortho after surgery today Code Status: DNR Family Communication:  Disposition Plan: follow on med surg Consultants:   ortho Procedures:    Antimicrobials:  Anti-infectives (From admission, onward)   Start     Dose/Rate Route Frequency Ordered Stop   11/17/18 0600  ceFAZolin (ANCEF) IVPB 2g/100 mL premix     2 g 200 mL/hr over 30 Minutes Intravenous On call to O.R. 11/16/18 2011 11/17/18 0950       Objective: Vitals:   11/17/18 0032 11/17/18 0548  11/17/18 0724 11/17/18 1050  BP: (!) 170/97 (!) 175/90  (!) 150/88  Pulse: 79 82  86  Resp:  16  (!) 22  Temp: 98 F (36.7 C) (!) 97.5 F (36.4 C)  97.7 F (36.5 C)  TempSrc: Oral Oral    SpO2:  95%  92%  Weight:   78.7 kg   Height:   6' (1.829 m)     Intake/Output Summary (Last 24 hours) at 11/17/2018 1108 Last data filed at 11/17/2018 1053 Gross per 24 hour  Intake 900 ml  Output 930 ml  Net -30 ml   Filed Weights   11/17/18 0724  Weight: 78.7 kg    Examination: General exam: Appears comfortable  HEENT: PERRLA, oral mucosa moist, no sclera icterus or thrush Respiratory system: Clear to auscultation. Respiratory effort normal. Cardiovascular system: S1 & S2 heard, RRR.   Gastrointestinal system: Abdomen soft, non-tender, nondistended. Normal bowel sounds. Central nervous system: Alert and oriented. No focal neurological deficits. Extremities: No cyanosis, clubbing or edema Skin: No rashes or ulcers Psychiatry:  Mood & affect appropriate.     Data Reviewed: I have personally reviewed following labs and imaging studies  CBC: Recent Labs  Lab 11/16/18 2023 11/17/18 0126  WBC 11.8* 9.6  HGB 12.7* 12.7*  HCT 38.5* 38.6*  MCV 93.4 93.9  PLT 163 938   Basic Metabolic Panel: Recent Labs  Lab 11/16/18 2023 11/17/18 0126  NA 135 137  K 4.3 4.7  CL 106 109  CO2 19* 19*  GLUCOSE 120* 109*  BUN 35* 35*  CREATININE 2.44* 2.40*  CALCIUM 8.8* 8.7*   GFR: Estimated Creatinine Clearance: 25.6 mL/min (A) (by C-G formula based on SCr of 2.4 mg/dL (H)). Liver Function Tests: No results for input(s): AST, ALT, ALKPHOS, BILITOT, PROT, ALBUMIN in the last 168 hours. No results for input(s): LIPASE, AMYLASE in the last 168 hours. No results for input(s): AMMONIA in the last 168 hours. Coagulation Profile: No results for input(s): INR, PROTIME in the last 168 hours. Cardiac Enzymes: No results for input(s): CKTOTAL, CKMB, CKMBINDEX, TROPONINI in the last 168 hours. BNP  (last 3 results) No results for input(s): PROBNP in the last 8760 hours. HbA1C: No results for input(s): HGBA1C in the last 72 hours. CBG: No results for input(s): GLUCAP in the last 168 hours. Lipid Profile: No results for input(s): CHOL, HDL, LDLCALC, TRIG, CHOLHDL, LDLDIRECT in the last 72 hours. Thyroid Function Tests: No results for input(s): TSH, T4TOTAL, FREET4, T3FREE, THYROIDAB in the last 72 hours. Anemia Panel: No results for input(s): VITAMINB12, FOLATE, FERRITIN, TIBC, IRON, RETICCTPCT in the last 72 hours. Urine analysis:    Component Value Date/Time   COLORURINE YELLOW 05/10/2017 0935   APPEARANCEUR CLEAR 05/10/2017 0935   LABSPEC 1.012 05/10/2017 0935   PHURINE 5.0 05/10/2017 0935   GLUCOSEU NEGATIVE 05/10/2017 0935   HGBUR NEGATIVE 05/10/2017 0935   BILIRUBINUR NEGATIVE 05/10/2017 0935   KETONESUR NEGATIVE 05/10/2017 0935   PROTEINUR 30 (A) 05/10/2017 0935   UROBILINOGEN 0.2 04/07/2007 1446   NITRITE NEGATIVE 05/10/2017 0935   LEUKOCYTESUR NEGATIVE 05/10/2017 0935   Sepsis Labs: @LABRCNTIP (procalcitonin:4,lacticidven:4) ) Recent Results (from the past 240 hour(s))  Surgical PCR screen     Status: Abnormal   Collection Time: 11/16/18 10:24 PM  Result Value Ref Range Status   MRSA, PCR POSITIVE (A) NEGATIVE Final    Comment: RESULT CALLED TO, READ BACK BY AND VERIFIED WITH: D RHINEHART RN 11/17/18 0055 JDW    Staphylococcus aureus POSITIVE (A) NEGATIVE Final    Comment: (NOTE) The Xpert SA Assay (FDA approved for NASAL specimens in patients 44 years of age and older), is one component of a comprehensive surveillance program. It is not intended to diagnose infection nor to guide or monitor treatment. Performed at [facility], [place] [address], [place], [place] [address]          Radiology Studies: Dg Hip Port Unilat With Pelvis 1v Right  Result Date: 11/16/2018 CLINICAL DATA:  85 year old male with fall and right hip pain. EXAM: DG HIP (WITH OR  WITHOUT PELVIS) 1V PORT RIGHT COMPARISON:  Right hip radiograph dated 11/16/2018 and CT dated 11/16/2018 FINDINGS: There is slightly impacted fracture of the right femoral neck as seen previously. No new fractures. Left femoral neck fixation screws. The bones are osteopenic. There is no dislocation. Vascular calcification and stents noted. The soft tissues are unremarkable. IMPRESSION: Slightly impacted fracture of the right femoral neck as seen previously. No additional fractures. Electronically Signed   By: Anner Crete M.D.   On: 11/16/2018 20:54      Scheduled Meds: . [MAR Hold] amLODipine  10 mg Oral Daily  . fentaNYL      . [MAR Hold] mupirocin ointment  1 application Nasal BID  . [MAR Hold] sodium chloride flush  3 mL Intravenous Q12H   Continuous Infusions: . sodium chloride Stopped (11/17/18 1053)     LOS: 1 day  Debbe Odea, MD [facility] Pager: [email or web address] Password [email or web address] 11/17/2018, 11:08 AM

## 2018-11-17 NOTE — Consult Note (Addendum)
ORTHOPAEDIC CONSULTATION  REQUESTING PHYSICIAN: Debbe Odea, MD  Chief Complaint: R hip fracture  HPI: Larry Aguilar is a 85 y.o. male with multiple medical comorbidities who had a ground-level fall resulting in a right hip fracture.  He had a history of a CRP P of the left hip remotely though he is not able to identify the surgeon or the date.  He is ambulatory at baseline.  No family at bedside.  No other areas of injury.  Past Medical History:  Diagnosis Date   AAA (abdominal aortic aneurysm) ([place])    CKD (chronic kidney disease), stage III ([place])    Hypertension    Kidney disease    Left kidney mass,  polycystic kidney   Stroke [facility])    Syncope    Past Surgical History:  Procedure Laterality Date   ABDOMINAL AORTIC ENDOVASCULAR STENT GRAFT N/A 05/11/2017   Procedure: ABDOMINAL AORTIC ENDOVASCULAR STENT GRAFT- WITH ENDOANCHOR;  Surgeon: Waynetta Sandy, MD;  Location: [facility];  Service: Vascular;  Laterality: N/A;   AORTOGRAM N/A 05/11/2017   Procedure: AORTOGRAM with RIGHT LOWER EXTREMITY STENT;  Surgeon: Waynetta Sandy, MD;  Location: [facility];  Service: Vascular;  Laterality: N/A;   APPENDECTOMY     CARDIOVASCULAR STRESS TEST  06/12/2012   Lexiscan, no evidence of meaningful reversible perfusion abnormalities, no evidence of myocardial ischemia, fixed septal defect - related to an artifact.   EYE SURGERY Bilateral    cataracts surgery   FRACTURE SURGERY Left    ORIF femur    HEMORRHOID SURGERY     INGUINAL HERNIA REPAIR Bilateral 2014   SPINE SURGERY     By Dr. Hardin Negus    THYROID SURGERY     For Goiter removal    Social History   Socioeconomic History   Marital status: Married    Spouse name: Not on file   Number of children: Not on file   Years of education: Not on file   Highest education level: Not on file  Occupational History   Not on file  Social Needs   Financial resource strain: Not on file   Food  insecurity:    Worry: Not on file    Inability: Not on file   Transportation needs:    Medical: Not on file    Non-medical: Not on file  Tobacco Use   Smoking status: Current Every Day Smoker    Packs/day: 2.00   Smokeless tobacco: Never Used  Substance and Sexual Activity   Alcohol use: No   Drug use: No   Sexual activity: Not on file  Lifestyle   Physical activity:    Days per week: Not on file    Minutes per session: Not on file   Stress: Not on file  Relationships   Social connections:    Talks on phone: Not on file    Gets together: Not on file    Attends religious service: Not on file    Active member of club or organization: Not on file    Attends meetings of clubs or organizations: Not on file    Relationship status: Not on file  Other Topics Concern   Not on file  Social History Narrative   Not on file   Family History  Problem Relation Age of Onset   Heart attack Father    Heart disease Father    Hyperlipidemia Father    Hypertension Father    Cancer Mother  lymphoma    Heart disease Mother    Hyperlipidemia Mother    Hypertension Mother    Cancer Daughter        Thyroid cancer    Hypertension Son    Diabetes Son    Hypertension Daughter    Allergies  Allergen Reactions   Penicillins Other (See Comments)    Did it involve swelling of the face/tongue/throat, SOB, or low BP? Unknown Did it involve sudden or severe rash/hives, skin peeling, or any reaction on the inside of your mouth or nose? Unknown Did you need to seek medical attention at a hospital or doctor's office? Unknown When did it last happen?unknown If all above answers are "NO", may proceed with cephalosporin use. *Patient has tolerated Cefuroxime in past   Prior to Admission medications   Medication Sig Start Date End Date Taking? Authorizing Provider  amLODipine (NORVASC) 10 MG tablet Take 10 mg by mouth daily. For blood pressure 10/12/18  Yes  [provider]  aspirin EC 81 MG tablet Take 81 mg by mouth daily.   Yes [provider]   Dg Hip Port Unilat With Pelvis 1v Right  Result Date: 11/16/2018 CLINICAL DATA:  85 year old male with fall and right hip pain. EXAM: DG HIP (WITH OR WITHOUT PELVIS) 1V PORT RIGHT COMPARISON:  Right hip radiograph dated 11/16/2018 and CT dated 11/16/2018 FINDINGS: There is slightly impacted fracture of the right femoral neck as seen previously. No new fractures. Left femoral neck fixation screws. The bones are osteopenic. There is no dislocation. Vascular calcification and stents noted. The soft tissues are unremarkable. IMPRESSION: Slightly impacted fracture of the right femoral neck as seen previously. No additional fractures. Electronically Signed   By: Anner Crete M.D.   On: 11/16/2018 20:54   Family History Reviewed and non-contributory, no pertinent history of problems with bleeding or anesthesia      Review of Systems 14 system ROS conducted and negative except for that noted in HPI   OBJECTIVE  Vitals: Patient Vitals for the past 8 hrs:  BP Temp Temp src Pulse Resp SpO2 Height Weight  11/17/18 0724 -- -- -- -- -- -- 6' (1.829 m) 78.7 kg  11/17/18 0548 (!) 175/90 (!) 97.5 F (36.4 C) Oral 82 16 95 % -- --  11/17/18 0032 (!) 170/97 98 F (36.7 C) Oral 79 -- -- -- --   General: Alert, no acute distress Cardiovascular: Warm extremities noted Respiratory: No cyanosis, no use of accessory musculature GI: No organomegaly, abdomen is soft and non-tender Skin: No lesions in the area of chief complaint other than those listed below in MSK exam.  Neurologic: Sensation intact distally save for the below mentioned MSK exam Psychiatric: Patient is competent for consent with normal mood and affect Lymphatic: No swelling obvious and reported other than the area involved in the exam below Extremities  R Lower extremity:Shortened and externally rotated.  ROM deferred. +  GS/TA/EHL. Sensation intact in DP/SP/S/S/P distributions. 2+ DP pulse with warm and well perfused digits. Compartments soft and compressible, with no pain on passive stretch.      Test Results Imaging X-rays and CT reviewed demonstrating a mildly valgus impacted right femoral neck fracture with a small amount of comminution laterally however overall appropriate alignment.  Labs cbc Recent Labs    11/16/18 2023 11/17/18 0126  WBC 11.8* 9.6  HGB 12.7* 12.7*  HCT 38.5* 38.6*  PLT 163 158    Labs inflam No results for input(s): CRP in the last  72 hours.  Invalid input(s): ESR  Labs coag No results for input(s): INR, PTT in the last 72 hours.  Invalid input(s): PT  Recent Labs    11/16/18 2023 11/17/18 0126  NA 135 137  K 4.3 4.7  CL 106 109  CO2 19* 19*  GLUCOSE 120* 109*  BUN 35* 35*  CREATININE 2.44* 2.40*  CALCIUM 8.8* 8.7*     ASSESSMENT AND PLAN: 85 y.o. male with the following: Right impacted femoral neck fracture and patient has had a successful CRP P on the contralateral side.  Discussed the nature of the injury as well as the care with the patient as well as the family.  Nonoperative measures are not well tolerated as patient's on bedrest for extended periods of time tend to develop secondary issues such as pneumonia, urinary tract infections, bedsores and delirium.  Based on this our recommendation is for operative measures.  We specifically discussed risks and benefits of surgery including perioperative mortality, infection, nonunion, malunion and screw cut out.  His fracture pattern does lead him to a risk of vascular insufficiency of the femoral head which could cause failure and need for conversion to total hip arthroplasty with a later date.  Based on the patient's comorbidities we feel that a smaller surgery is appropriate for him.  Plan for CRP P today.  Patient agrees with plan.  Will discuss with his family Larry Aguilar as well via phone.

## 2018-11-17 NOTE — Anesthesia Procedure Notes (Signed)
Procedure Name: Intubation Date/Time: 11/17/2018 9:43 AM Performed by: Cleda Daub, CRNA Pre-anesthesia Checklist: Patient identified, Emergency Drugs available, Suction available and Patient being monitored Patient Re-evaluated:Patient Re-evaluated prior to induction Oxygen Delivery Method: Circle system utilized Preoxygenation: Pre-oxygenation with 100% oxygen Induction Type: IV induction Ventilation: Mask ventilation without difficulty and Mask ventilation throughout procedure Laryngoscope Size: Mac and 4 Grade View: Grade I Tube type: Oral Tube size: 7.5 mm Number of attempts: 1 Airway Equipment and Method: Stylet Placement Confirmation: ETT inserted through vocal cords under direct vision,  positive ETCO2 and breath sounds checked- equal and bilateral Secured at: 23 cm Tube secured with: Tape Dental Injury: Teeth and Oropharynx as per pre-operative assessment

## 2018-11-18 ENCOUNTER — Inpatient Hospital Stay (HOSPITAL_COMMUNITY): Payer: Medicare Other

## 2018-11-18 ENCOUNTER — Encounter (HOSPITAL_COMMUNITY): Payer: Self-pay | Admitting: Surgery

## 2018-11-18 DIAGNOSIS — T148XXA Other injury of unspecified body region, initial encounter: Secondary | ICD-10-CM

## 2018-11-18 LAB — BASIC METABOLIC PANEL
Anion gap: 8 (ref 5–15)
BUN: 37 mg/dL — ABNORMAL HIGH (ref 8–23)
CO2: 19 mmol/L — ABNORMAL LOW (ref 22–32)
Calcium: 8.5 mg/dL — ABNORMAL LOW (ref 8.9–10.3)
Chloride: 109 mmol/L (ref 98–111)
Creatinine, Ser: 2.61 mg/dL — ABNORMAL HIGH (ref 0.61–1.24)
GFR calc Af Amer: 25 mL/min — ABNORMAL LOW (ref >60)
GFR calc non Af Amer: 22 mL/min — ABNORMAL LOW (ref >60)
Glucose, Bld: 98 mg/dL (ref 70–99)
Potassium: 4.2 mmol/L (ref 3.5–5.1)
Sodium: 136 mmol/L (ref 135–145)

## 2018-11-18 LAB — CBC
HCT: 36.1 % — ABNORMAL LOW (ref 39.0–52.0)
Hemoglobin: 12.3 g/dL — ABNORMAL LOW (ref 13.0–17.0)
MCH: 31.9 pg (ref 26.0–34.0)
MCHC: 34.1 g/dL (ref 30.0–36.0)
MCV: 93.8 fL (ref 80.0–100.0)
Platelets: 142 10*3/uL — ABNORMAL LOW (ref 150–400)
RBC: 3.85 MIL/uL — ABNORMAL LOW (ref 4.22–5.81)
RDW: 13.1 % (ref 11.5–15.5)
WBC: 8.2 10*3/uL (ref 4.0–10.5)
nRBC: 0 % (ref 0.0–0.2)

## 2018-11-18 MED ORDER — DOCUSATE SODIUM 100 MG PO CAPS: 100.0000 mg | ORAL_CAPSULE | Freq: Two times a day (BID) | ORAL | 0 refills | Status: AC

## 2018-11-18 MED ORDER — ONDANSETRON HCL 4 MG PO TABS: 4.0000 mg | ORAL_TABLET | Freq: Three times a day (TID) | ORAL | 1 refills | Status: AC | PRN

## 2018-11-18 MED ORDER — OXYCODONE HCL 5 MG PO TABS: ORAL_TABLET | ORAL | 0 refills | Status: AC

## 2018-11-18 MED ORDER — SENNOSIDES-DOCUSATE SODIUM 8.6-50 MG PO TABS: 1.0000 | ORAL_TABLET | Freq: Every day | ORAL | 0 refills | Status: AC

## 2018-11-18 MED ORDER — AMLODIPINE BESYLATE 10 MG PO TABS: 10.0000 mg | ORAL_TABLET | Freq: Every day | ORAL | 0 refills | Status: DC

## 2018-11-18 MED ORDER — MUPIROCIN 2 % EX OINT: 1.0000 "application " | TOPICAL_OINTMENT | Freq: Two times a day (BID) | CUTANEOUS | 0 refills | Status: AC

## 2018-11-18 MED ORDER — SODIUM BICARBONATE 650 MG PO TABS: 650.0000 mg | ORAL_TABLET | Freq: Two times a day (BID) | ORAL | 0 refills | Status: AC

## 2018-11-18 MED ORDER — CELECOXIB 200 MG PO CAPS: 200.0000 mg | ORAL_CAPSULE | Freq: Two times a day (BID) | ORAL | 0 refills | Status: AC

## 2018-11-18 MED ORDER — OXYCODONE HCL 5 MG PO TABS: ORAL_TABLET | ORAL | 0 refills | Status: DC

## 2018-11-18 MED ORDER — SODIUM BICARBONATE 650 MG PO TABS
650.0000 mg | ORAL_TABLET | Freq: Two times a day (BID) | ORAL | Status: DC
  Administered 2018-11-18: 650 mg via ORAL
  Filled 2018-11-18: qty 1

## 2018-11-18 MED ORDER — ACETAMINOPHEN 500 MG PO TABS: 1000.0000 mg | ORAL_TABLET | Freq: Three times a day (TID) | ORAL | 0 refills | Status: AC

## 2018-11-18 MED ORDER — ENOXAPARIN SODIUM 40 MG/0.4ML ~~LOC~~ SOLN: 40.0000 mg | SUBCUTANEOUS | 1 refills | Status: AC

## 2018-11-18 NOTE — Plan of Care (Signed)
Problem: Education: Goal: Knowledge of General Education information will improve Description Including pain rating scale, medication(s)/side effects and non-pharmacologic comfort measures Outcome: Progressing   Problem: Health Behavior/Discharge Planning: Goal: Ability to manage health-related needs will improve Outcome: Progressing   Problem: Clinical Measurements: Goal: Ability to maintain clinical measurements within normal limits will improve Outcome: Progressing Goal: Respiratory complications will improve Outcome: Progressing Goal: Cardiovascular complication will be avoided Outcome: Progressing   Problem: Nutrition: Goal: Adequate nutrition will be maintained Outcome: Progressing   Problem: Coping: Goal: Level of anxiety will decrease Outcome: Progressing   Problem: Elimination: Goal: Will not experience complications related to urinary retention Outcome: Progressing   Problem: Pain Managment: Goal: General experience of comfort will improve Outcome: Progressing   Problem: Safety: Goal: Ability to remain free from injury will improve Outcome: Progressing   Problem: Skin Integrity: Goal: Risk for impaired skin integrity will decrease Outcome: Progressing

## 2018-11-18 NOTE — Progress Notes (Signed)
ORTHOPAEDIC PROGRESS NOTE  s/p Procedure(s): Right CANNULATED HIP PINNING   SUBJECTIVE: Reports mild pain about operative site. No chest pain. No SOB. No nausea/vomiting. No other complaints.  OBJECTIVE: Pe: right lower extremity: incision CDI, leg lengths equal, warm well perfused foot, intact EHL/TA/GSC   Vitals:   11/17/18 2214 11/18/18 0324  BP: (!) 157/94 (!) 189/99  Pulse: 79 94  Resp:    Temp: 97.7 F (36.5 C) 97.9 F (36.6 C)  SpO2: 95% 91%     ASSESSMENT: Larry Aguilar is a 85 y.o. male doing well postoperatively.  PLAN: Weightbearing: WBAT RLE  Insicional and dressing care: OK to remove dressings 7 days postop, leave steri strips and leave open to air with dry gauze PRN Orthopedic device(s): none Showering: PRN VTE prophylaxis: Lovenox 40mg  qd x6 weeks, if ambulating well will transition to aspirin in clinic Pain control: PRN meds, minimize narcs Follow - up plan: 2 weeks with XR in clinic Contact information:  Weekdays 8-5 Ophelia Charter MD [phone], After hours and holidays please check [email or web address] for group call information for [facility]

## 2018-11-18 NOTE — TOC Transition Note (Addendum)
Transition of Care [facility]) - CM/SW Discharge Note   Patient Details  Name: Larry Aguilar MRN: [id number] Date of Birth: 22-Nov-1933  Transition of [facility]) CM/SW Contact:  Claudie Leach, RN Phone Number: 11/18/2018, 12:39 PM   Clinical Narrative:   Pt had surgery yesterday and worked with PT this am.  Pt recommends 24 hour supervision, which can be provided by 2 daughters and personal care assistant.  Pt will need 3n1 and RW.   Final next level of care: [place] Barriers to Discharge: No Barriers Identified   Patient Goals and CMS Choice Patient states their goals for this hospitalization and ongoing recovery are:: to go home CMS Medicare.gov Compare Post Acute Care list provided to:: Patient Represenative (must comment)(daughter Tonya over the phone) Choice offered to / list presented to : Adult Children  Discharge Placement  Reviewed Medicare .gov list with daughter over the phone.  [facility] chosen but not able to take patient due to RN staffing.    [facility] unable to take due to insurance.   Sharmon Revere with [facility] accepted referral.  Information placed on AVS.                   Discharge Plan and Services In-house Referral: NA Discharge Planning Services: CM Consult Post Acute Care Choice: Durable Medical Equipment, Home Health          DME Arranged: 3-N-1, Walker rolling DME Agency: [facility] HH Arranged: RN, PT, Social Work, Nurse's Aide [place] agency: [facility]   Readmission Risk Interventions No flowsheet data found.
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
## 2018-11-18 NOTE — Progress Notes (Signed)
Walker and 3in1 delivered to bedside.

## 2018-11-18 NOTE — Evaluation (Signed)
Physical Therapy Evaluation Patient Details Name: Larry Aguilar MRN: [id number] DOB: 01/24/1934 Today's Date: 11/18/2018   History of Present Illness  85 y.o. male with medical history significant for hypertension, history of CVA, CKD stage 4, AAA s/p endovascular stent graft 04/2017, frequent falls, and chronic tobacco use who initially presented to [facility] emergency department after a fall with resulted hip fx, now s/p pinning.  Clinical Impression  Orders received for PT evaluation. Patient demonstrates deficits in functional mobility as indicated below. Will benefit from continued skilled PT to address deficits and maximize function. Will see as indicated and progress as tolerated.   At this time patient is not safe for d/c home alone. Patient unreceptive to therapy cues or assist, shows no insight or awareness related to RLE deficits. Patient attempting to refuse use of RW, agitated throughout session and frustrated with therapist for using gait belt. Patient with 3 near complete falls during session due to RLE buckling and patient also so frustrated with his inability to move that he would demonstrate outbursts and not allow therapist to physically assist. At this time recommendation would be post acute rehabilitation although do not think patient would be receptive to this. If family decides to take patient home, he will need hands on physical assist at all times as he is unable to mobilize safely. Will also need to confirm equipment at home as patient will need RW for mobility and recommend BSC for elevated toilets as patient with difficulty during power up.    Follow Up Recommendations SNF;Supervision for mobility/OOB    Equipment Recommendations  Rolling walker with 5" wheels    Recommendations for Other Services       Precautions / Restrictions Precautions Precautions: Fall Restrictions Weight Bearing Restrictions: Yes RLE Weight Bearing: Weight bearing as tolerated       Mobility  Bed Mobility Overal bed mobility: Needs Assistance Bed Mobility: Supine to Sit     Supine to sit: Min assist     General bed mobility comments: min assist for RLE movement to EOB, max multi modal cues to direct to task, posterior list requiring assist to come to sitting upright at EOB  Transfers Overall transfer level: Needs assistance Equipment used: Rolling walker (2 wheeled) Transfers: Sit to/from Stand Sit to Stand: Min assist;Mod assist         General transfer comment: Moderate assist to power up to standing, non receptive to cues or feedback, fallign backwards during attempts despite assist. Min assist to power up from recliner with use of arm rests.  Ambulation/Gait Ambulation/Gait assistance: Mod assist;Max assist;+2 physical assistance(3 near falls requiring max assist to prevent ) Gait Distance (Feet): 40 Feet Assistive device: Rolling walker (2 wheeled) Gait Pattern/deviations: Step-to pattern;Decreased stride length;Decreased weight shift to right;Shuffle;Antalgic;Trunk flexed Gait velocity: decreased Gait velocity interpretation: <1.31 ft/sec, indicative of household ambulator General Gait Details: patient with noted RLE weakness and buckling (suspect pain related but unclear) patient with inability to maintain balance in upright, poor navigation or use of RW. Heavy external physical assist required Moderate to maximal assist for safety and fall prevention  Stairs            Wheelchair Mobility    Modified Rankin (Stroke Patients Only)       Balance Overall balance assessment: Needs assistance   Sitting balance-Leahy Scale: Poor Sitting balance - Comments: posterior list at EOB   Standing balance support: During functional activity;Bilateral upper extremity supported Standing balance-Leahy Scale: Poor Standing balance comment: heavy external assist  and use of RW                             Pertinent Vitals/Pain Pain  Assessment: Faces Faces Pain Scale: Hurts even more Pain Location: right hip with weight bearing Pain Descriptors / Indicators: Grimacing;Guarding Pain Intervention(s): Monitored during session    Home Living Family/patient expects to be discharged to:: Private residence Living Arrangements: Alone Available Help at Discharge: Family;Available PRN/intermittently Type of Home: House Home Access: Level entry     Home Layout: One level Home Equipment: Walker - 2 wheels;Cane - single point;Bedside commode;Shower seat;Wheelchair - manual Additional Comments: questionable historian, reports he lives alone but provide contradiciting information thorughout session    Prior Function           Comments: unclear     Hand Dominance   Dominant Hand: Right    Extremity/Trunk Assessment   Upper Extremity Assessment Upper Extremity Assessment: Generalized weakness    Lower Extremity Assessment Lower Extremity Assessment: RLE deficits/detail;Difficult to assess due to impaired cognition RLE Deficits / Details: noted LE weakness, RLE buckling with movement, unable to formally assess due to patient agitation RLE Coordination: decreased fine motor;decreased gross motor       Communication   Communication: HOH  Cognition Arousal/Alertness: Awake/alert Behavior During Therapy: Agitated Overall Cognitive Status: Impaired/Different from baseline Area of Impairment: Orientation;Attention;Memory;Following commands;Safety/judgement;Awareness;Problem solving                 Orientation Level: Disoriented to;Time;Situation Current Attention Level: Sustained Memory: Decreased short-term memory;Decreased recall of precautions Following Commands: Follows one step commands with increased time Safety/Judgement: Decreased awareness of safety;Decreased awareness of deficits Awareness: Intellectual Problem Solving: Slow processing;Requires verbal cues;Requires tactile cues General  Comments: patient very agitated and frustrated, difficult to focus on task performance, during mobility attempts patient increased frustrated by pain and inability to perform functional tasks that he would have outburts of agitation.      General Comments      Exercises     Assessment/Plan    PT Assessment Patient needs continued PT services  PT Problem List Decreased strength;Decreased activity tolerance;Decreased balance;Decreased mobility;Pain;Decreased safety awareness;Decreased cognition;Decreased coordination;Decreased knowledge of use of DME       PT Treatment Interventions DME instruction;Stair training;Gait training;Functional mobility training;Therapeutic activities;Therapeutic exercise;Balance training;Neuromuscular re-education;Cognitive remediation;Patient/family education    PT Goals (Current goals can be found in the Care Plan section)  Acute Rehab PT Goals Patient Stated Goal: to get out of here PT Goal Formulation: With patient Time For Goal Achievement: 12/02/18 Potential to Achieve Goals: Fair    Frequency Min 5X/week   Barriers to discharge Decreased caregiver support      Co-evaluation               AM-PAC PT "6 Clicks" Mobility  Outcome Measure Help needed turning from your back to your side while in a flat bed without using bedrails?: A Little Help needed moving from lying on your back to sitting on the side of a flat bed without using bedrails?: A Little Help needed moving to and from a bed to a chair (including a wheelchair)?: A Lot Help needed standing up from a chair using your arms (e.g., wheelchair or bedside chair)?: A Lot Help needed to walk in hospital room?: A Lot Help needed climbing 3-5 steps with a railing? : Total 6 Click Score: 13    End of Session Equipment Utilized During Treatment: Gait belt Activity Tolerance:  Treatment limited secondary to agitation Patient left: in chair;with call bell/phone within reach;with chair alarm  set Nurse Communication: Mobility status PT Visit Diagnosis: Muscle weakness (generalized) (M62.81);History of falling (Z91.81);Difficulty in walking, not elsewhere classified (R26.2);Pain Pain - Right/Left: Right Pain - part of body: Leg    Time: 1994-1290 PT Time Calculation (min) (ACUTE ONLY): 23 min   Charges:   PT Evaluation $PT Eval Moderate Complexity: 1 Mod          Alben Deeds, PT DPT  Board Certified Neurologic Specialist Acute Rehabilitation Services Pager [phone] Office [phone]   Duncan Dull 11/18/2018, 12:34 PM

## 2018-11-19 ENCOUNTER — Encounter (HOSPITAL_COMMUNITY): Payer: Self-pay | Admitting: Orthopaedic Surgery

## 2018-11-19 DIAGNOSIS — E872 Acidosis, unspecified: Secondary | ICD-10-CM

## 2018-11-19 NOTE — Discharge Summary (Signed)
Physician Discharge Summary  Larry Aguilar HYQ:[id number] DOB: May 07, 1934 DOA: 11/16/2018  PCP: Cyndi Bender, PA-C  Admit date: 11/16/2018 Discharge date: 11/19/2018  Admitted From: home Disposition:  home   Recommendations for Outpatient Follow-up:  1. Check Cr in 1 wk 2. He insists on going home today- declines SNF- spoke with daughter who states there are enough people to take care of him at home 3. Continue to encourage to quit smoking  Home Health:  ordered  Equipment/Devices:  Rolling walker and 3 in 1    Discharge Condition:  stable   CODE STATUS:  Full code   Diet recommendation:  Heart healthy Consultations:  Orthopedic surgery    Discharge Diagnoses:  Principal Problem:   Closed fracture of right hip ([place]) Active Problems:   Essential hypertension   History of CVA (cerebrovascular accident)   Tobacco use   Polycystic kidney disease   CKD (chronic kidney disease) stage 4, GFR 15-29 ml/min (HCC)      Brief Summary: Larry Aguilar is a 85 y.o.malewith medical history significant forhypertension, history of CVA, CKD stage 4, AAAs/pendovascular stent graft9/2018,frequent falls, and chronic tobacco use who initially presented to [facility] emergency department after a fall. He says he has frequent falls that occur suddenly without warning. X-ray of the right hip showed a cortical angulation laterally in the right femoral neck concerning for nondisplaced fracture. CT of the right hip without contrast showed a slightly impacted subcapital fracture of the right femoral neck.He was transferred to [facility] for surgery after discussion with Dr Griffin Basil.   Hospital Course:  Principal Problem:   Closed fracture of right hip after fall/ frequent falls - 4/4> Right hip closed reduction and percutaneous pinning by Dr Griffin Basil - prescribed the following post op- Lovenox 40 mg daily x 6 wks, Celecoxib, Oxycodone and laxatives  Active Problems:   Essential hypertension -  Norvasc      History of CVA (cerebrovascular accident) - on ASA 81 mg at home    Tobacco use - life long smoker- currently smoking 2-2.5 ppd -- declined nicotine patch but his daughter insisted he wear one as he gets agitated if he does not have it - needs oupt COPD eval with PFTs    Polycystic kidney disease   CKD (chronic kidney disease) stage 4, GFR 15-29 ml/min ([place]) - prior imaging reveals numerous cysts - no prior Cr to compare with but has GFR < 30 - he has a nephrologist and will be following up soon  Metabolic acidosis - due to above- added Sodium Bicarb tabs   Discharge Exam: Vitals:   11/18/18 0828 11/18/18 0951  BP: (!) 184/92 (!) 146/82  Pulse: 88 89  Resp: 18   Temp: 98.1 F (36.7 C)   SpO2: 97%    Vitals:   11/17/18 2214 11/18/18 0324 11/18/18 0828 11/18/18 0951  BP: (!) 157/94 (!) 189/99 (!) 184/92 (!) 146/82  Pulse: 79 94 88 89  Resp:   18   Temp: 97.7 F (36.5 C) 97.9 F (36.6 C) 98.1 F (36.7 C)   TempSrc: Oral Oral Oral   SpO2: 95% 91% 97%   Weight:      Height:        General: Pt is alert, awake, not in acute distress Cardiovascular: RRR, S1/S2 +, no rubs, no gallops Respiratory: CTA bilaterally, no wheezing, no rhonchi Abdominal: Soft, NT, ND, bowel sounds + Extremities: no edema, no cyanosis   Discharge Instructions   Allergies as of 11/18/2018  Reactions   Penicillins Other (See Comments)   Did it involve swelling of the face/tongue/throat, SOB, or low BP? Unknown Did it involve sudden or severe rash/hives, skin peeling, or any reaction on the inside of your mouth or nose? Unknown Did you need to seek medical attention at a hospital or doctor's office? Unknown When did it last happen?unknown If all above answers are "NO", may proceed with cephalosporin use. *Patient has tolerated Cefuroxime in past      Medication List    TAKE these medications   acetaminophen 500 MG tablet Commonly known as:  TYLENOL Take 2 tablets  (1,000 mg total) by mouth every 8 (eight) hours for 14 days.   amLODipine 10 MG tablet Commonly known as:  NORVASC Take 10 mg by mouth daily. For blood pressure What changed:  Another medication with the same name was removed. Continue taking this medication, and follow the directions you see here.   aspirin EC 81 MG tablet Take 81 mg by mouth daily.   celecoxib 200 MG capsule Commonly known as:  CELEBREX Take 1 capsule (200 mg total) by mouth 2 (two) times daily.   docusate sodium 100 MG capsule Commonly known as:  COLACE Take 1 capsule (100 mg total) by mouth 2 (two) times daily.   enoxaparin 40 MG/0.4ML injection Commonly known as:  LOVENOX Inject 0.4 mLs (40 mg total) into the skin daily.   mupirocin ointment 2 % Commonly known as:  BACTROBAN Place 1 application into the nose 2 (two) times daily.   ondansetron 4 MG tablet Commonly known as:  Zofran Take 1 tablet (4 mg total) by mouth every 8 (eight) hours as needed for up to 7 days for nausea or vomiting.   oxyCODONE 5 MG immediate release tablet Commonly known as:  Oxy IR/ROXICODONE Take 1pills every 4 hrs as needed for pain, no more than 6 per day   senna-docusate 8.6-50 MG tablet Commonly known as:  Senokot-S Take 1 tablet by mouth at bedtime.   sodium bicarbonate 650 MG tablet Take 1 tablet (650 mg total) by mouth 2 (two) times daily.      Follow-up Information    Hiram Gash, MD. Schedule an appointment as soon as possible for a visit in 2 weeks.   Specialty:  Orthopedic Surgery Contact information: [address] [place] [address] [phone]        Cyndi Bender, PA-C Follow up.   Specialty:  Physician Assistant Why:  follow up on blood pressure Contact information: [place] [address] [phone]        [facility] [place] Follow up.   Why:  Physical therapist, RN, Nursing assistant and social worker will contact you within 48 hours to arrange  visits.  Contact information: [place] [address] [phone]          Allergies  Allergen Reactions  . Penicillins Other (See Comments)    Did it involve swelling of the face/tongue/throat, SOB, or low BP? Unknown Did it involve sudden or severe rash/hives, skin peeling, or any reaction on the inside of your mouth or nose? Unknown Did you need to seek medical attention at a hospital or doctor's office? Unknown When did it last happen?unknown If all above answers are "NO", may proceed with cephalosporin use. *Patient has tolerated Cefuroxime in past     Procedures/Studies:    Dg C-arm 1-60 Min  Result Date: 11/17/2018 CLINICAL DATA:  Operative imaging  for ORIF of a right femoral neck fracture. EXAM: OPERATIVE right HIP (WITH PELVIS IF PERFORMED) 5 VIEWS TECHNIQUE: Fluoroscopic spot image(s) were submitted for interpretation post-operatively. COMPARISON:  11/16/2018 FINDINGS: Three screws have been placed across the subcapital fracture of the right femoral neck. There is slight residual displacement, femoral head lying superior to the femoral neck by approximately 4 mm. There is persistent valgus angulation. The orthopedic hardware is well-seated and positioned. No evidence of an operative complication. IMPRESSION: Operative fixation of the subcapital fracture of the right femoral neck as described. Electronically Signed   By: Lajean Manes M.D.   On: 11/17/2018 11:11   Dg Hip Port Unilat With Pelvis 1v Right  Result Date: 11/16/2018 CLINICAL DATA:  85 year old male with fall and right hip pain. EXAM: DG HIP (WITH OR WITHOUT PELVIS) 1V PORT RIGHT COMPARISON:  Right hip radiograph dated 11/16/2018 and CT dated 11/16/2018 FINDINGS: There is slightly impacted fracture of the right femoral neck as seen previously. No new fractures. Left femoral neck fixation screws. The bones are osteopenic. There is no dislocation. Vascular calcification and stents noted. The  soft tissues are unremarkable. IMPRESSION: Slightly impacted fracture of the right femoral neck as seen previously. No additional fractures. Electronically Signed   By: Anner Crete M.D.   On: 11/16/2018 20:54   Dg Hip Operative Unilat With Pelvis Right  Result Date: 11/17/2018 CLINICAL DATA:  Operative imaging for ORIF of a right femoral neck fracture. EXAM: OPERATIVE right HIP (WITH PELVIS IF PERFORMED) 5 VIEWS TECHNIQUE: Fluoroscopic spot image(s) were submitted for interpretation post-operatively. COMPARISON:  11/16/2018 FINDINGS: Three screws have been placed across the subcapital fracture of the right femoral neck. There is slight residual displacement, femoral head lying superior to the femoral neck by approximately 4 mm. There is persistent valgus angulation. The orthopedic hardware is well-seated and positioned. No evidence of an operative complication. IMPRESSION: Operative fixation of the subcapital fracture of the right femoral neck as described. Electronically Signed   By: Lajean Manes M.D.   On: 11/17/2018 11:11   Dg Hip Unilat With Pelvis 2-3 Views Right  Result Date: 11/18/2018 CLINICAL DATA:  Postop from right hip fracture ORIF. EXAM: DG HIP (WITH OR WITHOUT PELVIS) 2-3V RIGHT COMPARISON:  Operative images, 11/17/2018. FINDINGS: Three screws have been inserted across the femoral neck stabilizing the subcapital right femoral neck fracture. Appearance is unchanged from the operative exam. Orthopedic hardware is well-seated. Four screws are noted extending across the left femoral neck from an older left femoral neck ORIF. Hip joints, SI joints and symphysis pubis are normally spaced and aligned. IMPRESSION: Right subcapital femoral neck fracture stabilized with 3 screws. No significant displacement. No change in the degree of valgus angulation. No new fractures. Electronically Signed   By: Lajean Manes M.D.   On: 11/18/2018 09:46     The results of significant diagnostics from this  hospitalization (including imaging, microbiology, ancillary and laboratory) are listed below for reference.     Microbiology: Recent Results (from the past 240 hour(s))  Surgical PCR screen     Status: Abnormal   Collection Time: 11/16/18 10:24 PM  Result Value Ref Range Status   MRSA, PCR POSITIVE (A) NEGATIVE Final    Comment: RESULT CALLED TO, READ BACK BY AND VERIFIED WITH: D RHINEHART RN 11/17/18 0055 JDW    Staphylococcus aureus POSITIVE (A) NEGATIVE Final    Comment: (NOTE) The Xpert SA Assay (FDA approved for NASAL specimens in patients 44 years of age and older), is  one component of a comprehensive surveillance program. It is not intended to diagnose infection nor to guide or monitor treatment. Performed at [facility], [place] [address], [place], [place] [address]      Labs: BNP (last 3 results) No results for input(s): BNP in the last 8760 hours. Basic Metabolic Panel: Recent Labs  Lab 11/16/18 2023 11/17/18 0126 11/18/18 0241  NA 135 137 136  K 4.3 4.7 4.2  CL 106 109 109  CO2 19* 19* 19*  GLUCOSE 120* 109* 98  BUN 35* 35* 37*  CREATININE 2.44* 2.40* 2.61*  CALCIUM 8.8* 8.7* 8.5*   Liver Function Tests: No results for input(s): AST, ALT, ALKPHOS, BILITOT, PROT, ALBUMIN in the last 168 hours. No results for input(s): LIPASE, AMYLASE in the last 168 hours. No results for input(s): AMMONIA in the last 168 hours. CBC: Recent Labs  Lab 11/16/18 2023 11/17/18 0126 11/18/18 0241  WBC 11.8* 9.6 8.2  HGB 12.7* 12.7* 12.3*  HCT 38.5* 38.6* 36.1*  MCV 93.4 93.9 93.8  PLT 163 158 142*   Cardiac Enzymes: No results for input(s): CKTOTAL, CKMB, CKMBINDEX, TROPONINI in the last 168 hours. BNP: Invalid input(s): POCBNP CBG: No results for input(s): GLUCAP in the last 168 hours. D-Dimer No results for input(s): DDIMER in the last 72 hours. Hgb A1c No results for input(s): HGBA1C in the last 72 hours. Lipid Profile No results for input(s): CHOL, HDL,  LDLCALC, TRIG, CHOLHDL, LDLDIRECT in the last 72 hours. Thyroid function studies No results for input(s): TSH, T4TOTAL, T3FREE, THYROIDAB in the last 72 hours.  Invalid input(s): FREET3 Anemia work up No results for input(s): VITAMINB12, FOLATE, FERRITIN, TIBC, IRON, RETICCTPCT in the last 72 hours. Urinalysis    Component Value Date/Time   COLORURINE YELLOW 05/10/2017 0935   APPEARANCEUR CLEAR 05/10/2017 0935   LABSPEC 1.012 05/10/2017 0935   PHURINE 5.0 05/10/2017 0935   GLUCOSEU NEGATIVE 05/10/2017 0935   HGBUR NEGATIVE 05/10/2017 0935   BILIRUBINUR NEGATIVE 05/10/2017 0935   KETONESUR NEGATIVE 05/10/2017 0935   PROTEINUR 30 (A) 05/10/2017 0935   UROBILINOGEN 0.2 04/07/2007 1446   NITRITE NEGATIVE 05/10/2017 0935   LEUKOCYTESUR NEGATIVE 05/10/2017 0935   Sepsis Labs Invalid input(s): PROCALCITONIN,  WBC,  LACTICIDVEN Microbiology Recent Results (from the past 240 hour(s))  Surgical PCR screen     Status: Abnormal   Collection Time: 11/16/18 10:24 PM  Result Value Ref Range Status   MRSA, PCR POSITIVE (A) NEGATIVE Final    Comment: RESULT CALLED TO, READ BACK BY AND VERIFIED WITH: D RHINEHART RN 11/17/18 0055 JDW    Staphylococcus aureus POSITIVE (A) NEGATIVE Final    Comment: (NOTE) The Xpert SA Assay (FDA approved for NASAL specimens in patients 37 years of age and older), is one component of a comprehensive surveillance program. It is not intended to diagnose infection nor to guide or monitor treatment. Performed at [facility], [place] [address], [place],  [address]      Time coordinating discharge in minutes: 75  SIGNED:   Debbe Odea, MD  [facility] 11/19/2018, 11:08 AM Pager   If 7PM-7AM, please contact night-coverage [email or web address] Password [email or web address]

## 2019-02-13 DEATH — deceased

## 2019-09-16 IMAGING — RF DG C-ARM 61-120 MIN
1 series · 5 of 5 positions shown · non-contrast
Comparison: 11/16/2018

CLINICAL DATA: Operative imaging for ORIF of a right femoral neck
fracture.

EXAM:
OPERATIVE right HIP (WITH PELVIS IF PERFORMED) 5 VIEWS
TECHNIQUE: Fluoroscopic spot image(s) were submitted for interpretation
post-operatively.

[Series 1: run · 5 of 5 slices shown]
[im 1/5]
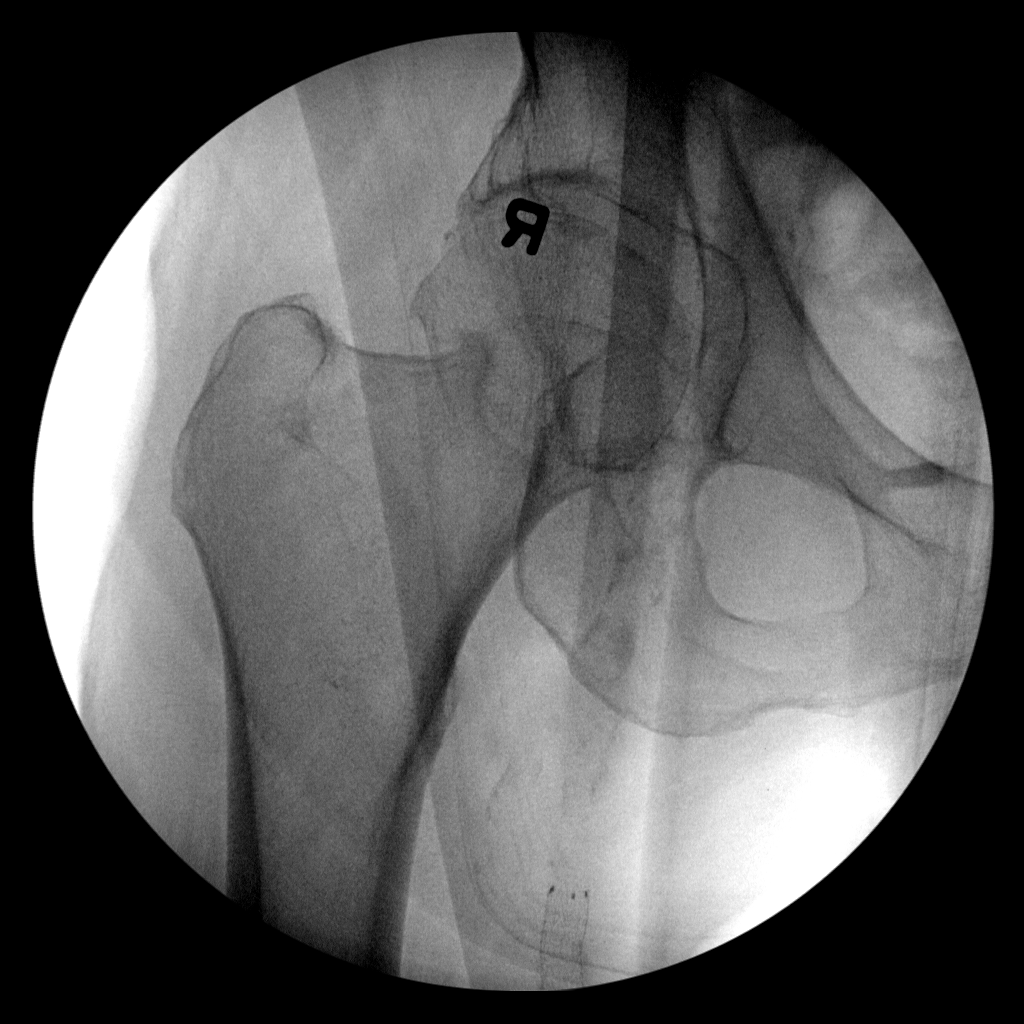
[im 2/5]
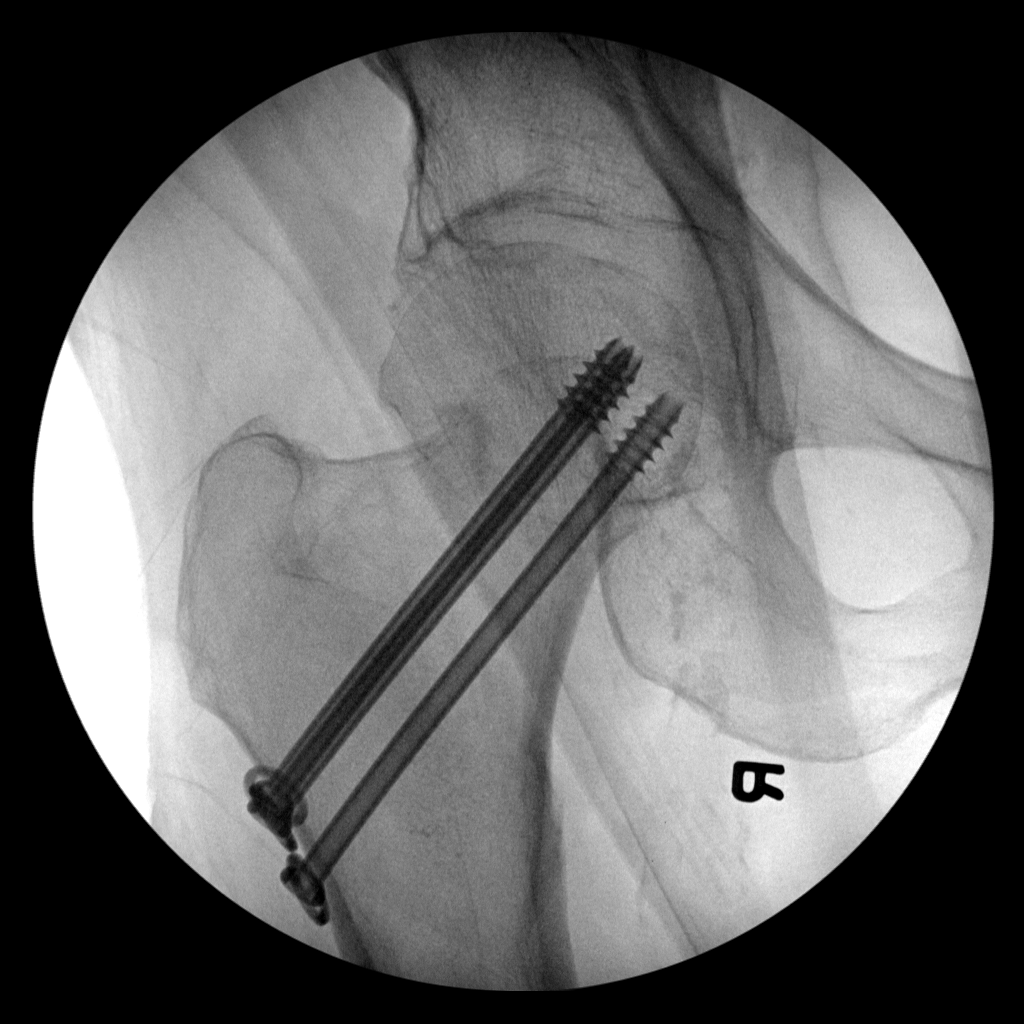
[im 3/5]
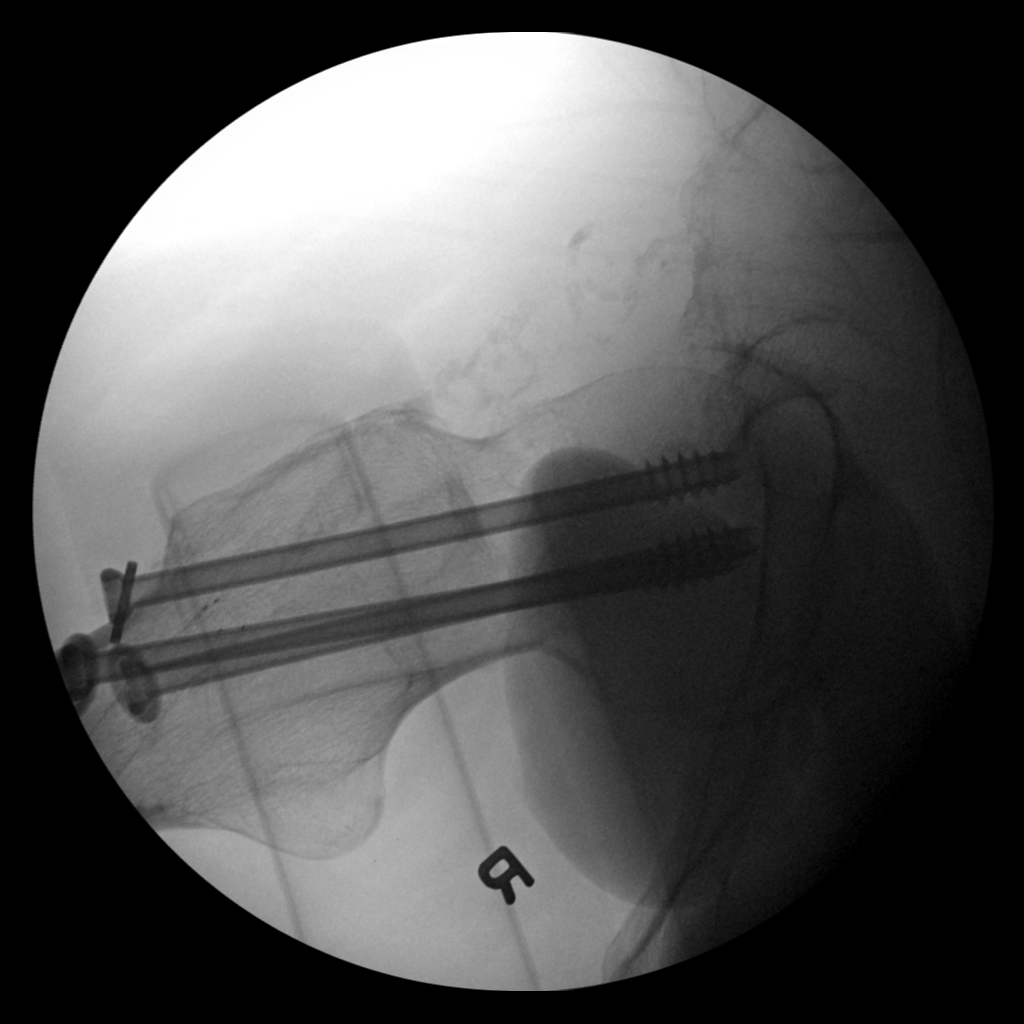
[im 4/5]
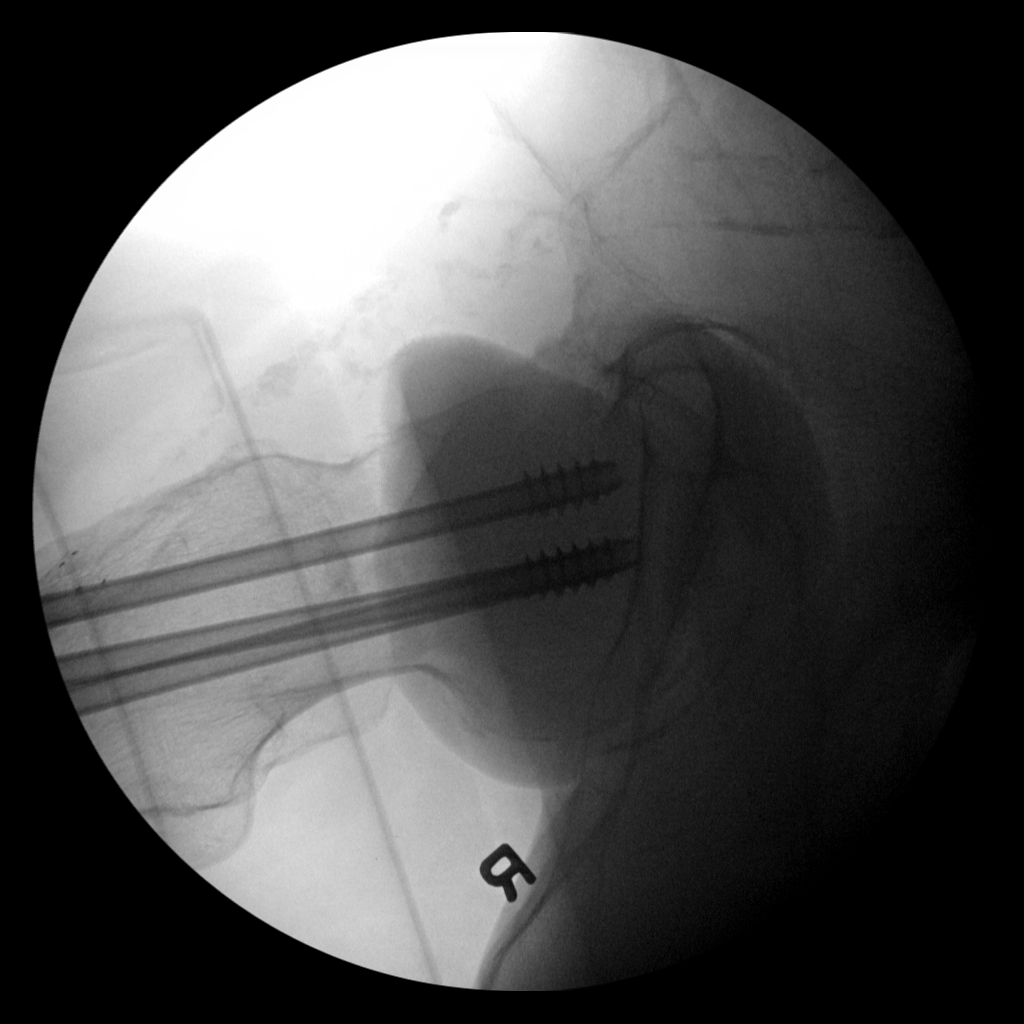
[im 5/5]
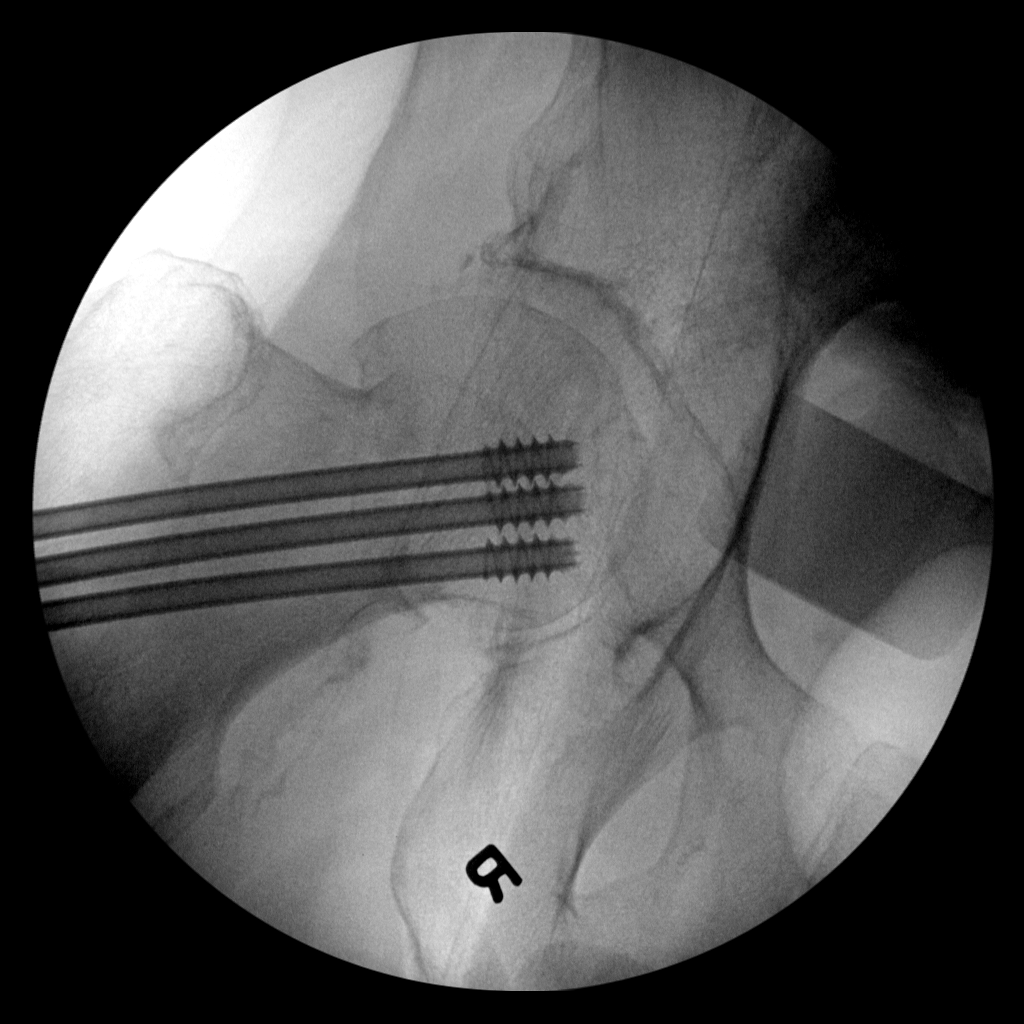

[5 of 5 positions shown; findings below may reference images not displayed]

FINDINGS: Three screws have been placed across the subcapital fracture of the
right femoral neck. There is slight residual displacement, femoral
head lying superior to the femoral neck by approximately 4 mm. There
is persistent valgus angulation.

The orthopedic hardware is well-seated and positioned.

No evidence of an operative complication.
IMPRESSION: Operative fixation of the subcapital fracture of the right femoral
neck as described.

## 2019-09-17 IMAGING — DX DG HIP (WITH OR WITHOUT PELVIS) 2-3V RIGHT
3 series · 3 of 3 positions shown · non-contrast
Comparison: Operative images, 11/17/2018.

CLINICAL DATA: Postop from right hip fracture ORIF.

EXAM:
DG HIP (WITH OR WITHOUT PELVIS) 2-3V RIGHT

[pelvis ap]
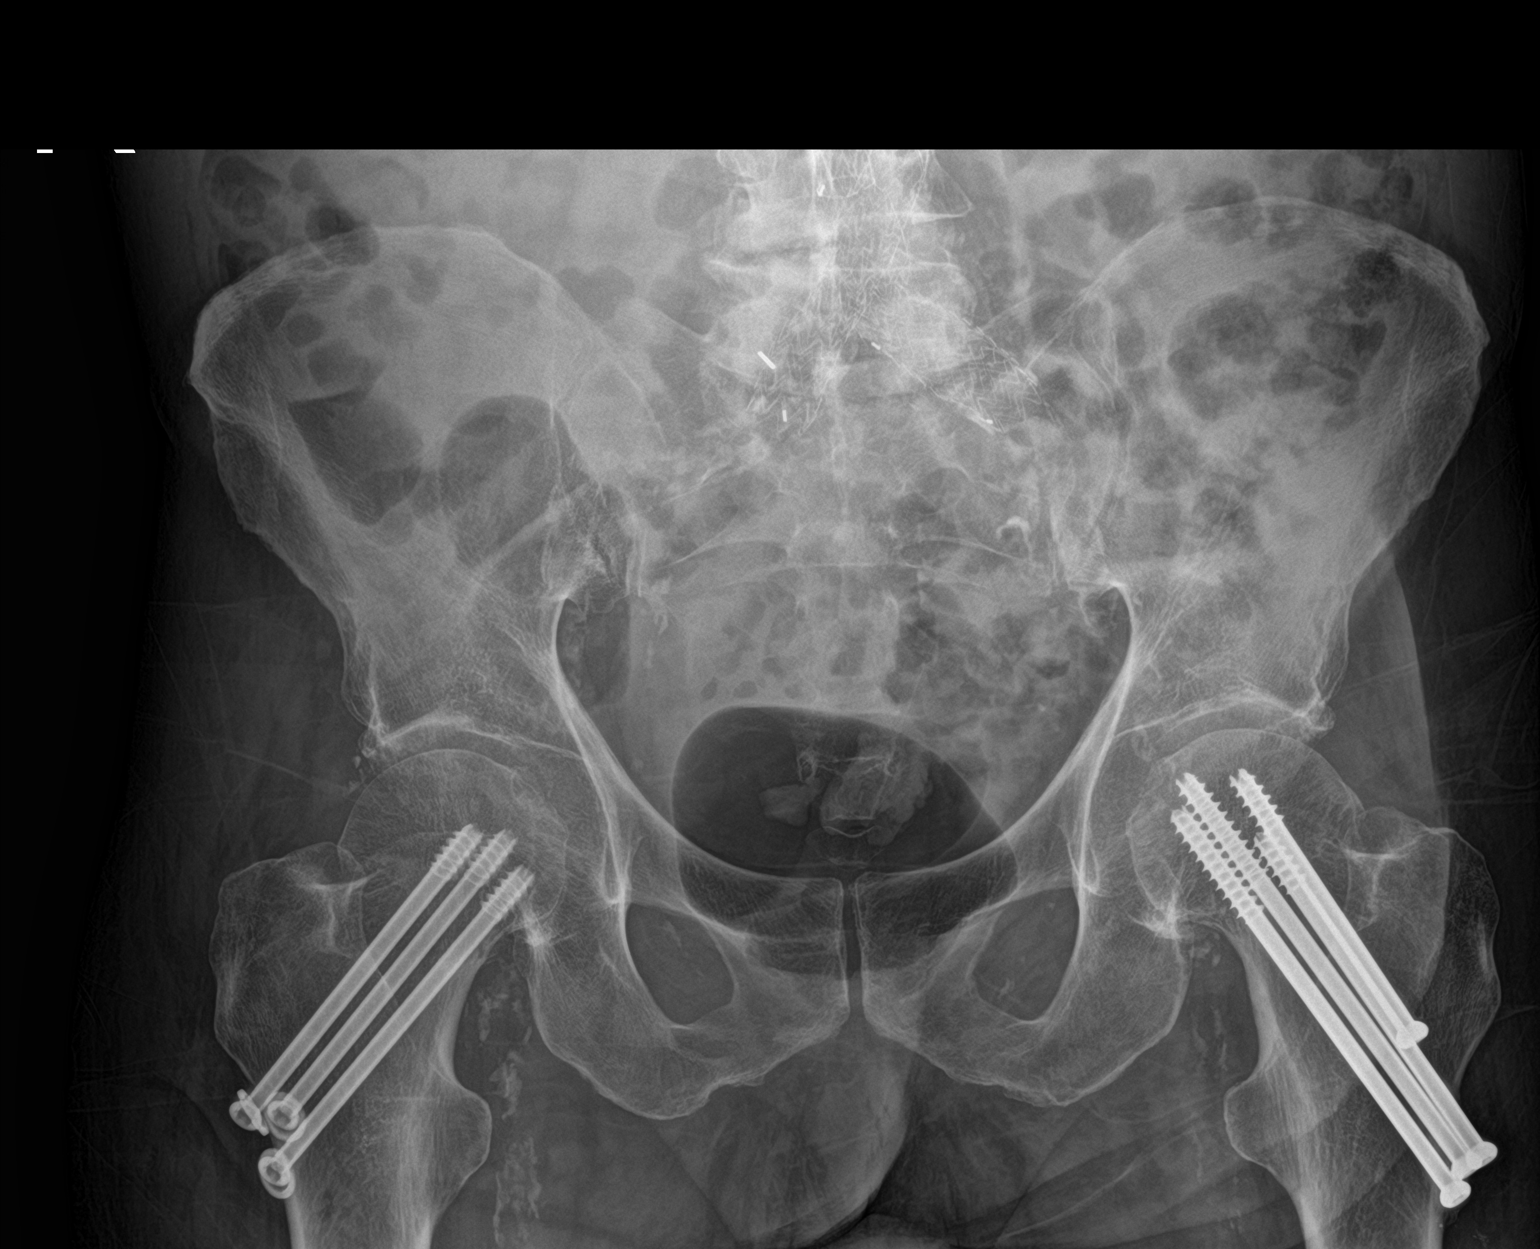

[hip ap]
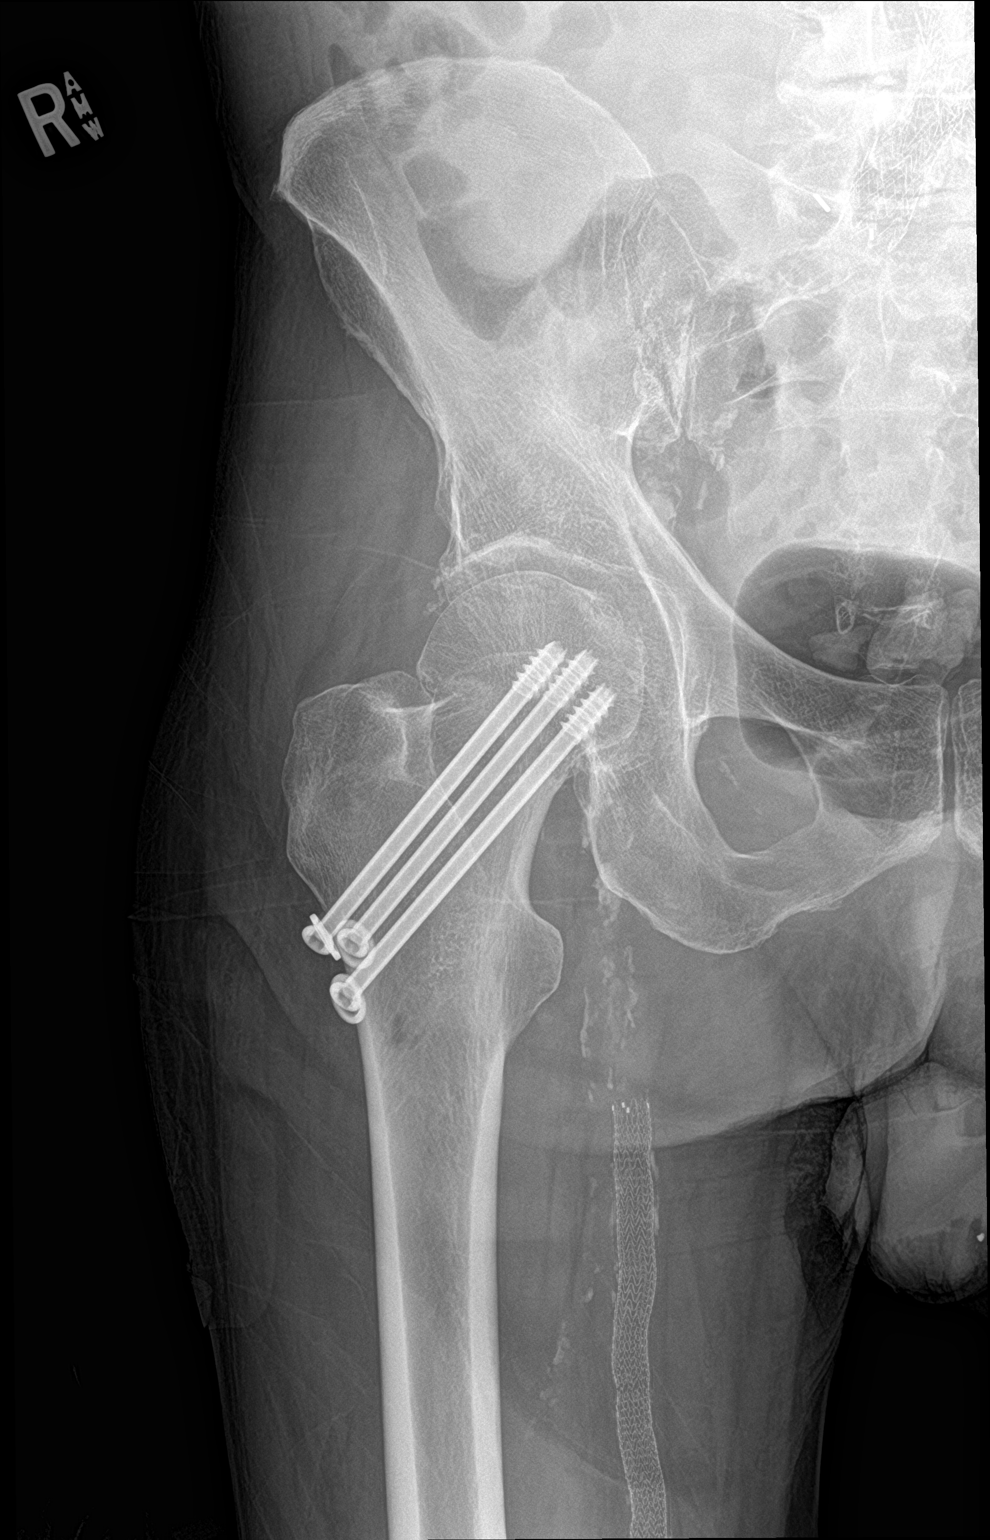

[hip lat]
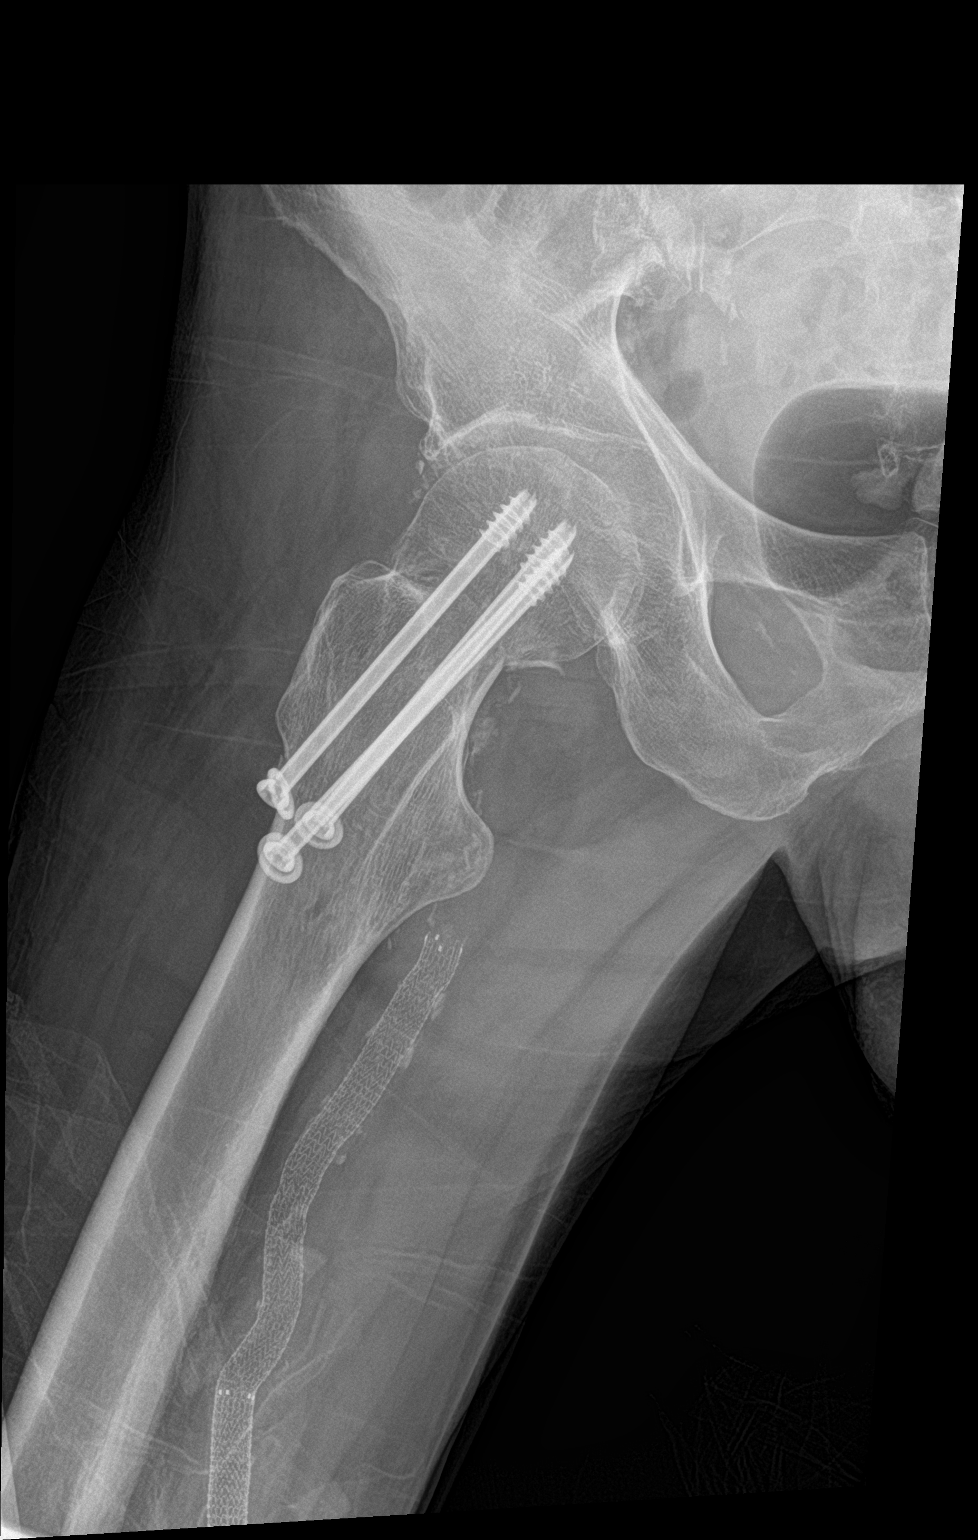

[3 of 3 positions shown; findings below may reference images not displayed]

FINDINGS: Three screws have been inserted across the femoral neck stabilizing
the subcapital right femoral neck fracture. Appearance is unchanged
from the operative exam. Orthopedic hardware is well-seated. Four
screws are noted extending across the left femoral neck from an
older left femoral neck ORIF.

Hip joints, SI joints and symphysis pubis are normally spaced and
aligned.
IMPRESSION: Right subcapital femoral neck fracture stabilized with 3 screws. No
significant displacement. No change in the degree of valgus
angulation. No new fractures.
